# Patient Record
Sex: Male | Born: 1970 | Race: White | Hispanic: No | Marital: Married | Smoking: Never smoker
Health system: Southern US, Community
[De-identification: ages and names within clinical notes are randomized; demographics above are authoritative.]

## PROBLEM LIST (undated history)

## (undated) DIAGNOSIS — I10 Essential (primary) hypertension: Secondary | ICD-10-CM

## (undated) DIAGNOSIS — E119 Type 2 diabetes mellitus without complications: Secondary | ICD-10-CM

## (undated) DIAGNOSIS — E785 Hyperlipidemia, unspecified: Secondary | ICD-10-CM

## (undated) HISTORY — PX: WISDOM TOOTH EXTRACTION: SHX21

## (undated) HISTORY — DX: Essential (primary) hypertension: I10

## (undated) HISTORY — DX: Hyperlipidemia, unspecified: E78.5

## (undated) HISTORY — DX: Type 2 diabetes mellitus without complications: E11.9

## (undated) HISTORY — PX: TONSILLECTOMY: SUR1361

---

## 2015-04-02 DIAGNOSIS — L723 Sebaceous cyst: Secondary | ICD-10-CM | POA: Insufficient documentation

## 2018-03-10 ENCOUNTER — Ambulatory Visit: Payer: Self-pay | Admitting: Medical

## 2018-03-13 ENCOUNTER — Ambulatory Visit: Payer: Self-pay | Admitting: Medical

## 2018-03-15 ENCOUNTER — Ambulatory Visit (INDEPENDENT_AMBULATORY_CARE_PROVIDER_SITE_OTHER): Payer: Managed Care, Other (non HMO) | Admitting: Medical

## 2018-03-15 ENCOUNTER — Ambulatory Visit (HOSPITAL_BASED_OUTPATIENT_CLINIC_OR_DEPARTMENT_OTHER)
Admission: RE | Admit: 2018-03-15 | Discharge: 2018-03-15 | Disposition: A | Discharge status: Home / Self Care | Payer: Managed Care, Other (non HMO) | Source: Ambulatory Visit | Attending: Medical | Admitting: Medical

## 2018-03-15 ENCOUNTER — Encounter: Payer: Self-pay | Admitting: Medical

## 2018-03-15 VITALS — BP 154/100 | HR 72 | Temp 97.9°F | Resp 16 | Ht 71.0 in | Wt 280.1 lb

## 2018-03-15 DIAGNOSIS — M79671 Pain in right foot: Secondary | ICD-10-CM | POA: Insufficient documentation

## 2018-03-15 DIAGNOSIS — I10 Essential (primary) hypertension: Secondary | ICD-10-CM

## 2018-03-15 DIAGNOSIS — R600 Localized edema: Secondary | ICD-10-CM | POA: Diagnosis not present

## 2018-03-15 DIAGNOSIS — E785 Hyperlipidemia, unspecified: Secondary | ICD-10-CM

## 2018-03-15 MED ORDER — HYDROCHLOROTHIAZIDE 12.5 MG PO CAPS: 12.5000 mg | ORAL_CAPSULE | Freq: Every day | ORAL | 3 refills | Status: DC

## 2018-03-15 NOTE — Patient Instructions (Signed)
Your blood pressure is high today.  I do want you to start diuretic HCTZ.  Then on Tuesday next week I want you to get nurse blood pressure check.  We will see if your blood pressure has improved adequately.  If not might need to add other medication to use in combination with HCTZ.  For history of high cholesterol, recommend low-cholesterol diet.  Will check a metabolic panel and lipid panel next Tuesday morning fasting.  You have minimal right ankle pedal edema.  This swelling appears to be dependent type edema.  If your ankle swelling does not decrease daily after sleeping let me know.  Also let me know if you get any calf swelling or pain behind the knee.  In that scenario would need to order ultrasound of the lower extremity.   For right heel pain, did place x-ray order to see if you might have spur formation.  Recommend using Dr. Jabier MuttonScholl heel pads daily.  You may be experiencing some plantar fasciitis.  Follow-up date to be determined after lab review and after reviewing the next week nurse blood pressure check.

## 2018-03-15 NOTE — Progress Notes (Signed)
Subjective:    Patient ID: Aloha GellJoseph Slappey, male    DOB: 12-02-1970, 47 y.o.   MRN: [id number]  HPI   Pt in for first time.  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 Grown children.  Hx of htn. He used to be on med/diuretic medication 3 years. He states med was low dose. Pt states was low dose 5 mg.(pt dad did have have MI in past with diabetics, htn and high cholesterol). Pt uncle dad side had MI in mid 4150's.  Rt foot- mild medial aspect swelling. Mild swelling couple of month. Wore by end of the day. The decrease in morning.  On foot 12-14 hours a day. Works maintenance. No sob or trauma.  Some high cholesterol in the past. Not sure to what degree.   Review of Systems  Constitutional: Negative for chills, fatigue and fever.  HENT: Negative for congestion and drooling.   Respiratory: Negative for cough, chest tightness, shortness of breath, wheezing and stridor.   Cardiovascular: Negative for chest pain and palpitations.  Gastrointestinal: Negative for abdominal pain.  Musculoskeletal: Negative for back pain, joint swelling and neck stiffness.       Rt medial ankle swelling at times. No ankle pain.  Some mild heel pain to rt foot end of the day.  Skin: Negative for rash.  Neurological: Negative for dizziness, seizures, syncope, weakness, light-headedness and numbness.  Hematological: Negative for adenopathy. Does not bruise/bleed easily.  Psychiatric/Behavioral: Negative for behavioral problems, confusion, hallucinations and sleep disturbance. The patient is not nervous/anxious.     Past Medical History:  Diagnosis Date  . Hyperlipidemia   . Hypertension      Social History   Socioeconomic History  . Marital status: Married    Spouse name: Not on file  . Number of children: Not on file  . Years of education: Not on file  . Highest education level: Not on file  Occupational History  . Not  on file  Social Needs  . Financial resource strain: Not on file  . Food insecurity:    Worry: Not on file    Inability: Not on file  . Transportation needs:    Medical: Not on file    Non-medical: Not on file  Tobacco Use  . Smoking status: Never Smoker  . Smokeless tobacco: Never Used  Substance and Sexual Activity  . Alcohol use: Not Currently  . Drug use: Never  . Sexual activity: Yes  Lifestyle  . Physical activity:    Days per week: Not on file    Minutes per session: Not on file  . Stress: Not on file  Relationships  . Social connections:    Talks on phone: Not on file    Gets together: Not on file    Attends religious service: Not on file    Active member of club or organization: Not on file    Attends meetings of clubs or organizations: Not on file    Relationship status: Not on file  . Intimate partner violence:    Fear of current or ex partner: Not on file    Emotionally abused: Not on file    Physically abused: Not on file    Forced sexual activity: Not on file  Other Topics Concern  . Not on file  Social History Narrative  . Not on file    Past Surgical History:  Procedure Laterality Date  . TONSILLECTOMY  Family History  Problem Relation Age of Onset  . Diabetes Father   . Kidney disease Father     Not on File  No current outpatient medications on file prior to visit.   No current facility-administered medications on file prior to visit.     BP (!) 154/100   Pulse 72   Temp 97.9 F (36.6 C) (Oral)   Resp 16   Ht 5\' 11"  (1.803 m)   Wt 280 lb 1.6 oz (127.1 kg)   SpO2 98%   BMI 39.07 kg/m       Objective:   Physical Exam  General Mental Status- Alert. General Appearance- Not in acute distress.   Skin General: Color- Normal Color. Moisture- Normal Moisture.  Neck Carotid Arteries- Normal color. Moisture- Normal Moisture. No carotid bruits. No JVD.  Chest and Lung Exam Auscultation: Breath  Sounds:-Normal.  Cardiovascular Auscultation:Rythm- Regular. Murmurs & Other Heart Sounds:Auscultation of the heart reveals- No Murmurs.  Abdomen Inspection:-Inspeection Normal. Palpation/Percussion:Note:No mass. Palpation and Percussion of the abdomen reveal- Non Tender, Non Distended + BS, no rebound or guarding.    Neurologic Cranial Nerve exam:- CN III-XII intact(No nystagmus), symmetric smile. Strength:- 5/5 equal and symmetric strength both upper and lower extremities.  Rt ankle- medial aspect faint 1+ edema at best.  Rt calf- symmetric to left side. No edema.  Rt lower ext- negative homans sign. Rt foot- no obvious pain presenlty. But note mid heel pain after working all day.     Assessment & Plan:  Your blood pressure is high today.  I do want you to start diuretic HCTZ.  Then on Tuesday next week I want you to get nurse blood pressure check.  We will see if your blood pressure has improved adequately.  If not might need to add other medication to use in combination with HCTZ.  For history of high cholesterol, recommend low-cholesterol diet.  Will check a metabolic panel and lipid panel next Tuesday morning fasting.  You have minimal right ankle pedal edema.  This swelling appears to be dependent type edema.  If your ankle swelling does not decrease daily after sleeping let me know.  Also let me know if you get any calf swelling or pain behind the knee.  In that scenario would need to order ultrasound of the lower extremity.   For right heel pain, did place x-ray order to see if you might have spur formation.  Recommend using Dr. Jabier Mutton heel pads daily.  You may be experiencing some plantar fasciitis.  Follow-up date to be determined after lab review and after reviewing the next week nurse blood pressure check.  Esperanza Richters, PA-C

## 2018-03-21 ENCOUNTER — Telehealth: Payer: Self-pay | Admitting: Medical

## 2018-03-21 ENCOUNTER — Other Ambulatory Visit (INDEPENDENT_AMBULATORY_CARE_PROVIDER_SITE_OTHER): Payer: Managed Care, Other (non HMO)

## 2018-03-21 ENCOUNTER — Ambulatory Visit (INDEPENDENT_AMBULATORY_CARE_PROVIDER_SITE_OTHER): Payer: Managed Care, Other (non HMO) | Admitting: Medical

## 2018-03-21 VITALS — BP 134/92 | HR 72

## 2018-03-21 DIAGNOSIS — I10 Essential (primary) hypertension: Secondary | ICD-10-CM

## 2018-03-21 DIAGNOSIS — E785 Hyperlipidemia, unspecified: Secondary | ICD-10-CM

## 2018-03-21 LAB — CBC WITH DIFFERENTIAL/PLATELET
BASOS ABS: 0 10*3/uL (ref 0.0–0.1)
Basophils Relative: 0.2 % (ref 0.0–3.0)
EOS ABS: 0.1 10*3/uL (ref 0.0–0.7)
Eosinophils Relative: 1.3 % (ref 0.0–5.0)
HCT: 45.3 % (ref 39.0–52.0)
Hemoglobin: 15.3 g/dL (ref 13.0–17.0)
LYMPHS ABS: 2.1 10*3/uL (ref 0.7–4.0)
Lymphocytes Relative: 35.6 % (ref 12.0–46.0)
MCHC: 33.9 g/dL (ref 30.0–36.0)
MCV: 83.6 fl (ref 78.0–100.0)
Monocytes Absolute: 0.5 10*3/uL (ref 0.1–1.0)
Monocytes Relative: 8.1 % (ref 3.0–12.0)
NEUTROS PCT: 54.8 % (ref 43.0–77.0)
Neutro Abs: 3.3 10*3/uL (ref 1.4–7.7)
Platelets: 176 10*3/uL (ref 150.0–400.0)
RBC: 5.42 Mil/uL (ref 4.22–5.81)
RDW: 14.6 % (ref 11.5–15.5)
WBC: 5.9 10*3/uL (ref 4.0–10.5)

## 2018-03-21 LAB — COMPREHENSIVE METABOLIC PANEL
ALT: 79 U/L — AB (ref 0–53)
AST: 39 U/L — AB (ref 0–37)
Albumin: 4.4 g/dL (ref 3.5–5.2)
Alkaline Phosphatase: 103 U/L (ref 39–117)
BUN: 16 mg/dL (ref 6–23)
CO2: 27 mEq/L (ref 19–32)
Calcium: 9.4 mg/dL (ref 8.4–10.5)
Chloride: 100 mEq/L (ref 96–112)
Creatinine, Ser: 0.89 mg/dL (ref 0.40–1.50)
GFR: 96.66 mL/min (ref >60.00)
GLUCOSE: 248 mg/dL — AB (ref 70–99)
Potassium: 3.5 mEq/L (ref 3.5–5.1)
SODIUM: 135 meq/L (ref 135–145)
TOTAL PROTEIN: 6.9 g/dL (ref 6.0–8.3)
Total Bilirubin: 0.8 mg/dL (ref 0.2–1.2)

## 2018-03-21 MED ORDER — METFORMIN HCL ER (MOD) 500 MG PO TB24: 500.0000 mg | ORAL_TABLET | Freq: Every day | ORAL | 0 refills | Status: DC

## 2018-03-21 NOTE — Telephone Encounter (Signed)
Would you call pt pharmacy and clarify that they can give generic extended release metformin.. Not sure if I picked generic.

## 2018-03-21 NOTE — Progress Notes (Addendum)
Pre visit review using our clinic tool,if applicable. No additional management support is needed unless otherwise documented below in the visit note.   Pt here for Blood pressure check per order from Whole FoodsEdward Saguier, PA-C   Pt currently takes: Hctz 12.5 mg daily. No complaints voiced this visit.   Pt reports compliance with medication.  BP today @ =134/92  P = 86  Pt advised per Esperanza RichtersEdward Saguier, PA-C to take BP daily for 7-10 days and cal back with readings. May add a low dose BP medication to the Hctz if BP is still in the same range. Patient notified and agreed.   Advised check bp daily and document readings. Continue hctz. May add low dose lisinopril 5 mg q day or losartan 25 mg daily if diastolic note less than 90 on follow up.  Esperanza RichtersEdward Saguier, PA-C

## 2018-03-22 ENCOUNTER — Telehealth: Payer: Self-pay | Admitting: Medical

## 2018-03-22 ENCOUNTER — Other Ambulatory Visit: Payer: Self-pay | Admitting: Medical

## 2018-03-22 NOTE — Telephone Encounter (Signed)
Copied from CRM [phone]. Topic: General - Other >> Mar 22, 2018  3:31 PM Stephannie LiSimmons, Aldridge Krzyzanowski L, NT wrote: Reason for CRM: Patient called and said his insurance does not cover the extended release metformin ,

## 2018-03-23 ENCOUNTER — Other Ambulatory Visit (INDEPENDENT_AMBULATORY_CARE_PROVIDER_SITE_OTHER): Payer: Managed Care, Other (non HMO)

## 2018-03-23 DIAGNOSIS — R739 Hyperglycemia, unspecified: Secondary | ICD-10-CM | POA: Diagnosis not present

## 2018-03-23 LAB — HEMOGLOBIN A1C: Hgb A1c MFr Bld: 8.9 % — ABNORMAL HIGH (ref 4.6–6.5)

## 2018-03-23 MED ORDER — METFORMIN HCL 500 MG PO TABS: 500.0000 mg | ORAL_TABLET | Freq: Two times a day (BID) | ORAL | 3 refills | Status: DC

## 2018-03-23 NOTE — Telephone Encounter (Signed)
Pharmacy want to know if they can change metformin so insurance will cover it.

## 2018-03-23 NOTE — Telephone Encounter (Signed)
Pt.notified

## 2018-03-23 NOTE — Telephone Encounter (Signed)
Let pt know sent in metformin. Instruction say twice daily. But advise he can just start taking 1 tab daily then at 2 weeks advance to twice daily. This can reduce common side effect of gi irritation when initially starting metformin.

## 2018-04-25 ENCOUNTER — Ambulatory Visit (INDEPENDENT_AMBULATORY_CARE_PROVIDER_SITE_OTHER): Payer: Managed Care, Other (non HMO) | Admitting: Medical

## 2018-04-25 ENCOUNTER — Encounter: Payer: Self-pay | Admitting: Medical

## 2018-04-25 ENCOUNTER — Telehealth: Payer: Self-pay | Admitting: Medical

## 2018-04-25 VITALS — BP 136/86 | HR 75 | Temp 98.1°F | Resp 16 | Ht 71.0 in | Wt 277.2 lb

## 2018-04-25 DIAGNOSIS — I10 Essential (primary) hypertension: Secondary | ICD-10-CM | POA: Diagnosis not present

## 2018-04-25 DIAGNOSIS — E118 Type 2 diabetes mellitus with unspecified complications: Secondary | ICD-10-CM | POA: Diagnosis not present

## 2018-04-25 DIAGNOSIS — E119 Type 2 diabetes mellitus without complications: Secondary | ICD-10-CM

## 2018-04-25 DIAGNOSIS — Z0100 Encounter for examination of eyes and vision without abnormal findings: Secondary | ICD-10-CM

## 2018-04-25 MED ORDER — LOSARTAN POTASSIUM 25 MG PO TABS: 25.0000 mg | ORAL_TABLET | Freq: Every day | ORAL | 3 refills | Status: DC

## 2018-04-25 MED ORDER — METFORMIN HCL ER 500 MG PO TB24: ORAL_TABLET | ORAL | 3 refills | Status: DC

## 2018-04-25 NOTE — Telephone Encounter (Signed)
Will you notify patient that I did put in referral for diabetic eye exam. I put this in after visit today. Will you notify him that referral is in process.

## 2018-04-25 NOTE — Patient Instructions (Addendum)
Your blood pressure today is less than 140/90 with diabetes I would like you to have better control.  Will be to continue the diuretic but I will add low-dose losartan 5 mg to your regimen.  This should hopefully bring your blood pressure down closer to 130/80 and provide protection for your kidneys.  Your blood sugar has improved some and you have made lifestyle modifications.  Continue with better diet and daily exercise.  Since your fasting sugars are on the high side 160 range in the mornings, I do think it is best to go ahead and increase the metformin to 2 tablets twice daily.  Follow-up in 3 months or as needed.

## 2018-04-25 NOTE — Progress Notes (Signed)
Subjective:    Patient ID: Anthony Zamora, male    DOB: 1970/11/01, 47 y.o.   MRN: [id number]  HPI  Pt in for follow up on his diabetes. Pt has glucometer but has not checked in past week. But before that he was checking in the morning and his blood sugars were 160.  Pt states he has cut out fried foods, white bread and potatoes.  Pt is on metformin twice a day. No side effects reported.  Pt also did start exercising. He is riding stationary bike 20-30 minute a day.    Review of Systems  Constitutional: Negative for chills, fatigue and fever.  Respiratory: Negative for cough, chest tightness, shortness of breath and wheezing.   Cardiovascular: Negative for chest pain and palpitations.  Gastrointestinal: Negative for abdominal pain, constipation, nausea and vomiting.  Musculoskeletal: Negative for back pain.  Skin: Negative for pallor and rash.  Neurological: Negative for dizziness, speech difficulty, weakness, light-headedness and headaches.  Hematological: Negative for adenopathy. Does not bruise/bleed easily.  Psychiatric/Behavioral: Negative for behavioral problems, confusion and self-injury. The patient is not nervous/anxious.     Past Medical History:  Diagnosis Date  . Hyperlipidemia   . Hypertension      Social History   Socioeconomic History  . Marital status: Married    Spouse name: Not on file  . Number of children: Not on file  . Years of education: Not on file  . Highest education level: Not on file  Occupational History  . Not on file  Social Needs  . Financial resource strain: Not on file  . Food insecurity:    Worry: Not on file    Inability: Not on file  . Transportation needs:    Medical: Not on file    Non-medical: Not on file  Tobacco Use  . Smoking status: Never Smoker  . Smokeless tobacco: Never Used  Substance and Sexual Activity  . Alcohol use: Not Currently  . Drug use: Never  . Sexual activity: Yes  Lifestyle  . Physical  activity:    Days per week: Not on file    Minutes per session: Not on file  . Stress: Not on file  Relationships  . Social connections:    Talks on phone: Not on file    Gets together: Not on file    Attends religious service: Not on file    Active member of club or organization: Not on file    Attends meetings of clubs or organizations: Not on file    Relationship status: Not on file  . Intimate partner violence:    Fear of current or ex partner: Not on file    Emotionally abused: Not on file    Physically abused: Not on file    Forced sexual activity: Not on file  Other Topics Concern  . Not on file  Social History Narrative  . Not on file    Past Surgical History:  Procedure Laterality Date  . TONSILLECTOMY      Family History  Problem Relation Age of Onset  . Diabetes Father   . Kidney disease Father     Not on File  Current Outpatient Medications on File Prior to Visit  Medication Sig Dispense Refill  . hydrochlorothiazide (MICROZIDE) 12.5 MG capsule Take 1 capsule (12.5 mg total) by mouth daily. 30 capsule 3  . metFORMIN (GLUCOPHAGE) 500 MG tablet Take 1 tablet (500 mg total) by mouth 2 (two) times daily with a meal. 60 tablet  3   No current facility-administered medications on file prior to visit.     BP (!) 138/93   Pulse 75   Temp 98.1 F (36.7 C) (Oral)   Resp 16   Ht 5\' 11"  (1.803 m)   Wt 277 lb 3.2 oz (125.7 kg)   SpO2 99%   BMI 38.66 kg/m       Objective:   Physical Exam  General Mental Status- Alert. General Appearance- Not in acute distress.   Skin General: Color- Normal Color. Moisture- Normal Moisture.  Neck Carotid Arteries- Normal color. Moisture- Normal Moisture. No carotid bruits. No JVD.  Chest and Lung Exam Auscultation: Breath Sounds:-Normal.  Cardiovascular Auscultation:Rythm- Regular. Murmurs & Other Heart Sounds:Auscultation of the heart reveals- No Murmurs.  Abdomen Inspection:-Inspeection  Normal. Palpation/Percussion:Note:No mass. Palpation and Percussion of the abdomen reveal- Non Tender, Non Distended + BS, no rebound or guarding.    Neurologic Cranial Nerve exam:- CN III-XII intact(No nystagmus), symmetric smile. Strength:- 5/5 equal and symmetric strength both upper and lower extremities.  Lower ext- see quality metrics.   Assessment & Plan:  Your blood pressure today is less than 140/90 with diabetes I would like you to have better control.  Will be to continue the diuretic but I will add low-dose losartan 5 mg to your regimen.  This should hopefully bring your blood pressure down closer to 130/80 and provide protection for your kidneys.  Your blood sugar has improved some and you have made lifestyle modifications.  Continue with better diet and daily exercise.  Since your fasting sugars are on the high side 160 range in the mornings, I do think it is best to go ahead and increase the metformin to 2 tablets twice daily.  Follow-up in 3 months or as needed.

## 2018-05-02 ENCOUNTER — Other Ambulatory Visit: Payer: Self-pay | Admitting: Medical

## 2018-06-12 ENCOUNTER — Other Ambulatory Visit: Payer: Self-pay | Admitting: Medical

## 2018-06-27 ENCOUNTER — Other Ambulatory Visit: Payer: Self-pay | Admitting: Medical

## 2018-07-26 ENCOUNTER — Encounter: Payer: Self-pay | Admitting: Medical

## 2018-07-26 ENCOUNTER — Ambulatory Visit (INDEPENDENT_AMBULATORY_CARE_PROVIDER_SITE_OTHER): Payer: Managed Care, Other (non HMO) | Admitting: Medical

## 2018-07-26 VITALS — BP 140/90 | HR 72 | Temp 98.2°F | Resp 16 | Ht 71.0 in | Wt 275.6 lb

## 2018-07-26 DIAGNOSIS — I10 Essential (primary) hypertension: Secondary | ICD-10-CM | POA: Diagnosis not present

## 2018-07-26 DIAGNOSIS — E785 Hyperlipidemia, unspecified: Secondary | ICD-10-CM

## 2018-07-26 DIAGNOSIS — E118 Type 2 diabetes mellitus with unspecified complications: Secondary | ICD-10-CM

## 2018-07-26 LAB — COMPREHENSIVE METABOLIC PANEL
ALT: 69 U/L — ABNORMAL HIGH (ref 0–53)
AST: 32 U/L (ref 0–37)
Albumin: 4.5 g/dL (ref 3.5–5.2)
Alkaline Phosphatase: 92 U/L (ref 39–117)
BILIRUBIN TOTAL: 0.6 mg/dL (ref 0.2–1.2)
BUN: 17 mg/dL (ref 6–23)
CALCIUM: 9.5 mg/dL (ref 8.4–10.5)
CO2: 25 mEq/L (ref 19–32)
Chloride: 103 mEq/L (ref 96–112)
Creatinine, Ser: 0.78 mg/dL (ref 0.40–1.50)
GFR: 112.39 mL/min (ref >60.00)
Glucose, Bld: 222 mg/dL — ABNORMAL HIGH (ref 70–99)
Potassium: 3.9 mEq/L (ref 3.5–5.1)
Sodium: 138 mEq/L (ref 135–145)
Total Protein: 6.8 g/dL (ref 6.0–8.3)

## 2018-07-26 LAB — LIPID PANEL
CHOLESTEROL: 184 mg/dL (ref 0–200)
HDL: 39.5 mg/dL (ref >39.00)
NonHDL: 144.21
Total CHOL/HDL Ratio: 5
Triglycerides: 372 mg/dL — ABNORMAL HIGH (ref 0.0–149.0)
VLDL: 74.4 mg/dL — ABNORMAL HIGH (ref 0.0–40.0)

## 2018-07-26 LAB — LDL CHOLESTEROL, DIRECT: Direct LDL: 76 mg/dL

## 2018-07-26 LAB — HEMOGLOBIN A1C: HEMOGLOBIN A1C: 8.7 % — AB (ref 4.6–6.5)

## 2018-07-26 NOTE — Progress Notes (Signed)
Subjective:    Patient ID: Anthony Zamora, male    DOB: February 10, 1971, 47 y.o.   MRN: [id number]  HPI  Pt in for follow up.  His bp had work has been upper 130/80 range. He states job not too stressful.  No cardiac or neurologic signs/symptoms.  Pt went up on metformin two 2 tab twice a day. Pt states has not checked his sugar in past 2 weeks. He states he has been cutting back on sugar in diet. Pt has been using treadmill at work 30 minutes 3 days a week.      Review of Systems  Constitutional: Negative for chills, fatigue and fever.  Respiratory: Negative for chest tightness, shortness of breath and wheezing.   Cardiovascular: Negative for chest pain and palpitations.  Gastrointestinal: Negative for abdominal pain.  Musculoskeletal: Negative for back pain.  Skin: Negative for rash.  Neurological: Negative for dizziness and light-headedness.  Hematological: Negative for adenopathy. Does not bruise/bleed easily.  Psychiatric/Behavioral: Negative for behavioral problems and confusion.    Past Medical History:  Diagnosis Date  . Hyperlipidemia   . Hypertension      Social History   Socioeconomic History  . Marital status: Married    Spouse name: Not on file  . Number of children: Not on file  . Years of education: Not on file  . Highest education level: Not on file  Occupational History  . Not on file  Social Needs  . Financial resource strain: Not on file  . Food insecurity:    Worry: Not on file    Inability: Not on file  . Transportation needs:    Medical: Not on file    Non-medical: Not on file  Tobacco Use  . Smoking status: Never Smoker  . Smokeless tobacco: Never Used  Substance and Sexual Activity  . Alcohol use: Not Currently  . Drug use: Never  . Sexual activity: Yes  Lifestyle  . Physical activity:    Days per week: Not on file    Minutes per session: Not on file  . Stress: Not on file  Relationships  . Social connections:    Talks on  phone: Not on file    Gets together: Not on file    Attends religious service: Not on file    Active member of club or organization: Not on file    Attends meetings of clubs or organizations: Not on file    Relationship status: Not on file  . Intimate partner violence:    Fear of current or ex partner: Not on file    Emotionally abused: Not on file    Physically abused: Not on file    Forced sexual activity: Not on file  Other Topics Concern  . Not on file  Social History Narrative  . Not on file    Past Surgical History:  Procedure Laterality Date  . TONSILLECTOMY      Family History  Problem Relation Age of Onset  . Diabetes Father   . Kidney disease Father     Not on File  Current Outpatient Medications on File Prior to Visit  Medication Sig Dispense Refill  . hydrochlorothiazide (MICROZIDE) 12.5 MG capsule TAKE 1 CAPSULE BY MOUTH EVERY DAY 90 capsule 2  . losartan (COZAAR) 25 MG tablet TAKE 1 TABLET BY MOUTH EVERY DAY 90 tablet 0  . metFORMIN (GLUCOPHAGE-XR) 500 MG 24 hr tablet TAKE 2 TABLETS BY MOUTH TWICE A DAY 120 tablet 5   No current  facility-administered medications on file prior to visit.     BP (!) 148/97   Pulse 72   Temp 98.2 F (36.8 C) (Oral)   Resp 16   Ht 5\' 11"  (1.803 m)   Wt 275 lb 9.6 oz (125 kg)   SpO2 99%   BMI 38.44 kg/m      Objective:   Physical Exam  General Mental Status- Alert. General Appearance- Not in acute distress.   Skin General: Color- Normal Color. Moisture- Normal Moisture.  Neck Carotid Arteries- Normal color. Moisture- Normal Moisture. No carotid bruits. No JVD.  Chest and Lung Exam Auscultation: Breath Sounds:-Normal.  Cardiovascular Auscultation:Rythm- Regular. Murmurs & Other Heart Sounds:Auscultation of the heart reveals- No Murmurs.  Abdomen Inspection:-Inspeection Normal. Palpation/Percussion:Note:No mass. Palpation and Percussion of the abdomen reveal- Non Tender, Non Distended + BS, no rebound or  guarding.    Neurologic Cranial Nerve exam:- CN III-XII intact(No nystagmus), symmetric smile. Strength:- 5/5 equal and symmetric strength both upper and lower extremities.       Assessment & Plan:  Your bp is borderline high today. You get better reading at work but want your bp to be closer to 130/80. Check bp daily and if bp confirmed high/closer to 140/90 then start losartan 50 mg daily/2 of your current 25 mg tabs and continue other bp meds the same.  For diabetes, continue exercise and metformin. Check cmp and a1c today.  Will check lipid panel as well.  Follow up date to be determined after lab review. Likely 3 months. Please call/update me in a week if advanced the losartan dose.  25 minutes spent with pt. 50% of time spent discussing plan on potential increase of bp med depending on daily bp readings.   Esperanza RichtersEdward Sheyann Sulton, PA-C

## 2018-07-26 NOTE — Patient Instructions (Signed)
Your bp is borderline high today. You get better reading at work but want your bp to be closer to 130/80. Check bp daily and if bp confirmed high/closer to 140/90 then start losartan 50 mg daily/2 of your current 25 mg tabs and continue other bp meds the same.  For diabetes, continue exercise and metformin. Check cmp and a1c today.  Will check lipid panel as well.  Follow up date to be determined after lab review. Likely 3 months. Please call/update me in a week if advanced the losartan dose.

## 2018-07-28 ENCOUNTER — Telehealth: Payer: Self-pay | Admitting: Medical

## 2018-07-28 ENCOUNTER — Other Ambulatory Visit: Payer: Self-pay | Admitting: Medical

## 2018-07-28 MED ORDER — SAXAGLIPTIN HCL 5 MG PO TABS: 5.0000 mg | ORAL_TABLET | Freq: Every day | ORAL | 3 refills | Status: DC

## 2018-07-28 MED ORDER — FENOFIBRATE 54 MG PO TABS: 54.0000 mg | ORAL_TABLET | Freq: Every day | ORAL | 3 refills | Status: DC

## 2018-07-28 NOTE — Telephone Encounter (Signed)
Rx januvia sent to pharmacy. 

## 2018-07-28 NOTE — Telephone Encounter (Signed)
Rx fenofibrate and onglyza sent to pt pharmacy.

## 2018-08-03 ENCOUNTER — Ambulatory Visit: Payer: Self-pay

## 2018-08-03 NOTE — Telephone Encounter (Signed)
Wife Marylene Land(Angela) who is on DPR, called wanting to know what medication pt is supposed to be taking. Discussed the medication list with her. Discussed his latest Hgb A1C. Wife discussed lifestyle changes her and the pt are going to make to bring down his Hgb A1C.  Reason for Disposition . General information question, no triage required and triager able to answer question  Answer Assessment - Initial Assessment Questions 1. REASON FOR CALL or QUESTION: "What is your reason for calling today?" or "How can I best help you?" or "What question do you have that I can help answer?"     Wife Marylene Land(Angela) who is on DPR, called wanting to know what medication pt is supposed to be taking. Discussed the medication list with her. Discussed his latest Hgb A1C. Wife discussed lifestyle changes her and the pt are going to make to bring down his Hgb A1C.  Protocols used: INFORMATION ONLY CALL-A-AH

## 2018-09-20 ENCOUNTER — Other Ambulatory Visit: Payer: Self-pay | Admitting: Medical

## 2018-10-03 ENCOUNTER — Other Ambulatory Visit: Payer: Self-pay | Admitting: Medical

## 2018-11-17 ENCOUNTER — Other Ambulatory Visit: Payer: Self-pay | Admitting: Medical

## 2018-11-17 MED ORDER — SITAGLIPTIN PHOSPHATE 100 MG PO TABS: 100.0000 mg | ORAL_TABLET | Freq: Every day | ORAL | 0 refills | Status: DC

## 2018-11-29 ENCOUNTER — Telehealth: Payer: Self-pay | Admitting: Medical

## 2018-11-29 NOTE — Telephone Encounter (Signed)
Pt overdue for diabetic and htn follow up. Can you go ahead and schedule him for visit. Early June inform may be virtual or in office(so labs can be done at that time). Depends on what is going on. If any acute issues now then can do virtual visit now.

## 2018-11-30 NOTE — Telephone Encounter (Signed)
LVM for pt to call and schedule fu appt in June per Provider.

## 2018-12-07 ENCOUNTER — Other Ambulatory Visit: Payer: Self-pay | Admitting: Medical

## 2019-02-04 NOTE — Patient Instructions (Addendum)
Your bp is  borderline  controlled but controlled at home high 120-140/80.  Continue losartan and hctz.  For diabetes continue metformin and januvia. DC extended release metformin.  For high triglycerides continue fenofibrate and will get lipid panel. Discussed benefit of low dose statin for him since diabetic.  Will get labs and notify you of results.  Follow up in 3 months or as needed

## 2019-02-04 NOTE — Progress Notes (Signed)
Subjective:    Patient ID: Anthony Zamora, male    DOB: 08-29-1970, 48 y.o.   MRN: [id number]  HPI  Pt has follow up for diabetes, htn and high cholesterol.  Last visit was 8.7 A1-c. Pt is on metformin and added januvia to hes med regimen. Recall on metformin ER. He had no gi side effects with regular form of metformin.  Pt also has high cholesterol/triglycerides and rx'd fenofibrate.   For htn pt has been prescribed  Losartan and hctz.  Pt is overdue for fasting labs.  Pt states he did by treadmill and he is exercising 3-4 times a week. 20-30 minutes. Pt is eating healing.     Review of Systems  Constitutional: Negative for chills, fatigue and fever.  HENT: Negative for congestion and dental problem.   Respiratory: Negative for cough, chest tightness, shortness of breath and wheezing.   Cardiovascular: Negative for chest pain and palpitations.  Gastrointestinal: Negative for abdominal distention, abdominal pain, constipation, nausea and rectal pain.  Endocrine: Negative for polydipsia, polyphagia and polyuria.  Genitourinary: Negative for dysuria, enuresis, flank pain, frequency, hematuria, testicular pain and urgency.  Musculoskeletal: Negative for back pain and myalgias.  Skin: Negative for rash.  Neurological: Negative for dizziness, weakness, light-headedness and headaches.  Hematological: Negative for adenopathy. Does not bruise/bleed easily.  Psychiatric/Behavioral: Negative for behavioral problems and confusion.    Past Medical History:  Diagnosis Date  . Hyperlipidemia   . Hypertension      Social History   Socioeconomic History  . Marital status: Married    Spouse name: Not on file  . Number of children: Not on file  . Years of education: Not on file  . Highest education level: Not on file  Occupational History  . Not on file  Social Needs  . Financial resource strain: Not on file  . Food insecurity    Worry: Not on file    Inability: Not on file   . Transportation needs    Medical: Not on file    Non-medical: Not on file  Tobacco Use  . Smoking status: Never Smoker  . Smokeless tobacco: Never Used  Substance and Sexual Activity  . Alcohol use: Not Currently  . Drug use: Never  . Sexual activity: Yes  Lifestyle  . Physical activity    Days per week: Not on file    Minutes per session: Not on file  . Stress: Not on file  Relationships  . Social Herbalist on phone: Not on file    Gets together: Not on file    Attends religious service: Not on file    Active member of club or organization: Not on file    Attends meetings of clubs or organizations: Not on file    Relationship status: Not on file  . Intimate partner violence    Fear of current or ex partner: Not on file    Emotionally abused: Not on file    Physically abused: Not on file    Forced sexual activity: Not on file  Other Topics Concern  . Not on file  Social History Narrative  . Not on file    Past Surgical History:  Procedure Laterality Date  . TONSILLECTOMY      Family History  Problem Relation Age of Onset  . Diabetes Father   . Kidney disease Father     Not on File  Current Outpatient Medications on File Prior to Visit  Medication Sig Dispense  Refill  . fenofibrate 54 MG tablet TAKE 1 TABLET BY MOUTH DAILY 90 tablet 2  . hydrochlorothiazide (MICROZIDE) 12.5 MG capsule TAKE 1 CAPSULE BY MOUTH EVERY DAY 90 capsule 2  . losartan (COZAAR) 25 MG tablet TAKE 1 TABLET BY MOUTH EVERY DAY 90 tablet 0  . metFORMIN (GLUCOPHAGE-XR) 500 MG 24 hr tablet TAKE 2 TABLETS BY MOUTH TWICE A DAY 120 tablet 5  . sitaGLIPtin (JANUVIA) 100 MG tablet Take 1 tablet (100 mg total) by mouth daily. 90 tablet 0   No current facility-administered medications on file prior to visit.         Objective:   Physical Exam  General Mental Status- Alert. General Appearance- Not in acute distress.   Skin General: Color- Normal Color. Moisture- Normal Moisture.   Neck Carotid Arteries- Normal color. Moisture- Normal Moisture. No carotid bruits. No JVD.  Chest and Lung Exam Auscultation: Breath Sounds:-Normal.  Cardiovascular Auscultation:Rythm- Regular. Murmurs & Other Heart Sounds:Auscultation of the heart reveals- No Murmurs.  Abdomen Inspection:-Inspeection Normal. Palpation/Percussion:Note:No mass. Palpation and Percussion of the abdomen reveal- Non Tender, Non Distended + BS, no rebound or guarding.   Neurologic Cranial Nerve exam:- CN III-XII intact(No nystagmus), symmetric smile. Drift Test:- No drift. Romberg Exam:- Negative.  Heal to Toe Gait exam:-Normal. Finger to Nose:- Normal/Intact Strength:- 5/5 equal and symmetric strength both upper and lower extremities.  Lower ext- see quality metrics.      Assessment & Plan:  Your bp is  borderline  controlled but controlled at home high 120's-140/80.  Continue losartan and hctz.  For diabetes continue metformin and januvia.DC extended release metformin.  For high triglycerides continue fenofibrate and will get lipid panel. Discussed benefit of low dose statin for him since diabetic.  Will get labs and notify you of results.  Follow up in 3 months or as needed

## 2019-02-05 ENCOUNTER — Ambulatory Visit (INDEPENDENT_AMBULATORY_CARE_PROVIDER_SITE_OTHER): Payer: Managed Care, Other (non HMO) | Admitting: Medical

## 2019-02-05 ENCOUNTER — Encounter: Payer: Self-pay | Admitting: Medical

## 2019-02-05 ENCOUNTER — Other Ambulatory Visit: Payer: Self-pay

## 2019-02-05 VITALS — BP 140/88 | HR 63 | Ht 71.0 in | Wt 276.0 lb

## 2019-02-05 DIAGNOSIS — I10 Essential (primary) hypertension: Secondary | ICD-10-CM | POA: Diagnosis not present

## 2019-02-05 DIAGNOSIS — Z0100 Encounter for examination of eyes and vision without abnormal findings: Secondary | ICD-10-CM | POA: Diagnosis not present

## 2019-02-05 DIAGNOSIS — E119 Type 2 diabetes mellitus without complications: Secondary | ICD-10-CM

## 2019-02-05 DIAGNOSIS — E118 Type 2 diabetes mellitus with unspecified complications: Secondary | ICD-10-CM | POA: Diagnosis not present

## 2019-02-05 DIAGNOSIS — E781 Pure hyperglyceridemia: Secondary | ICD-10-CM | POA: Diagnosis not present

## 2019-02-05 LAB — COMPREHENSIVE METABOLIC PANEL
ALT: 54 U/L — ABNORMAL HIGH (ref 0–53)
AST: 28 U/L (ref 0–37)
Albumin: 4.4 g/dL (ref 3.5–5.2)
Alkaline Phosphatase: 68 U/L (ref 39–117)
BUN: 17 mg/dL (ref 6–23)
CO2: 26 mEq/L (ref 19–32)
Calcium: 9.1 mg/dL (ref 8.4–10.5)
Chloride: 101 mEq/L (ref 96–112)
Creatinine, Ser: 0.87 mg/dL (ref 0.40–1.50)
GFR: 93.02 mL/min (ref >60.00)
Glucose, Bld: 158 mg/dL — ABNORMAL HIGH (ref 70–99)
Potassium: 4.1 mEq/L (ref 3.5–5.1)
Sodium: 137 mEq/L (ref 135–145)
Total Bilirubin: 0.7 mg/dL (ref 0.2–1.2)
Total Protein: 6.5 g/dL (ref 6.0–8.3)

## 2019-02-05 LAB — MICROALBUMIN / CREATININE URINE RATIO
Creatinine,U: 76.6 mg/dL
Microalb Creat Ratio: 0.9 mg/g (ref 0.0–30.0)
Microalb, Ur: <0.7 mg/dL (ref 0.0–1.9)

## 2019-02-05 LAB — LIPID PANEL
Cholesterol: 183 mg/dL (ref 0–200)
HDL: 39.6 mg/dL (ref >39.00)
NonHDL: 143.07
Total CHOL/HDL Ratio: 5
Triglycerides: 240 mg/dL — ABNORMAL HIGH (ref 0.0–149.0)
VLDL: 48 mg/dL — ABNORMAL HIGH (ref 0.0–40.0)

## 2019-02-05 LAB — HEMOGLOBIN A1C: Hgb A1c MFr Bld: 7.8 % — ABNORMAL HIGH (ref 4.6–6.5)

## 2019-02-05 LAB — LDL CHOLESTEROL, DIRECT: Direct LDL: 108 mg/dL

## 2019-02-05 MED ORDER — METFORMIN HCL 1000 MG PO TABS: 1000.0000 mg | ORAL_TABLET | Freq: Two times a day (BID) | ORAL | 3 refills | Status: DC

## 2019-02-05 NOTE — Addendum Note (Signed)
Addended by: Kelle Darting A on: 02/05/2019 08:46 AM   Modules accepted: Orders

## 2019-04-07 ENCOUNTER — Other Ambulatory Visit: Payer: Self-pay | Admitting: Internal Medicine

## 2019-04-24 ENCOUNTER — Other Ambulatory Visit: Payer: Self-pay | Admitting: Medical

## 2019-05-08 ENCOUNTER — Other Ambulatory Visit: Payer: Self-pay

## 2019-05-09 ENCOUNTER — Ambulatory Visit: Payer: Managed Care, Other (non HMO) | Admitting: Medical

## 2019-05-10 ENCOUNTER — Encounter: Payer: Self-pay | Admitting: Medical

## 2019-05-10 ENCOUNTER — Ambulatory Visit (HOSPITAL_BASED_OUTPATIENT_CLINIC_OR_DEPARTMENT_OTHER)
Admission: RE | Admit: 2019-05-10 | Discharge: 2019-05-10 | Disposition: A | Discharge status: Home / Self Care | Payer: Managed Care, Other (non HMO) | Source: Ambulatory Visit | Attending: Medical | Admitting: Medical

## 2019-05-10 ENCOUNTER — Ambulatory Visit (INDEPENDENT_AMBULATORY_CARE_PROVIDER_SITE_OTHER): Payer: Managed Care, Other (non HMO) | Admitting: Medical

## 2019-05-10 ENCOUNTER — Other Ambulatory Visit: Payer: Self-pay | Admitting: Medical

## 2019-05-10 ENCOUNTER — Other Ambulatory Visit: Payer: Self-pay

## 2019-05-10 VITALS — BP 130/80 | HR 71 | Temp 97.2°F | Resp 16 | Ht 71.0 in | Wt 271.6 lb

## 2019-05-10 DIAGNOSIS — I10 Essential (primary) hypertension: Secondary | ICD-10-CM

## 2019-05-10 DIAGNOSIS — M79671 Pain in right foot: Secondary | ICD-10-CM

## 2019-05-10 DIAGNOSIS — E785 Hyperlipidemia, unspecified: Secondary | ICD-10-CM | POA: Diagnosis not present

## 2019-05-10 DIAGNOSIS — Z23 Encounter for immunization: Secondary | ICD-10-CM | POA: Diagnosis not present

## 2019-05-10 DIAGNOSIS — E118 Type 2 diabetes mellitus with unspecified complications: Secondary | ICD-10-CM | POA: Diagnosis not present

## 2019-05-10 LAB — LIPID PANEL
Cholesterol: 176 mg/dL (ref 0–200)
HDL: 41.4 mg/dL (ref >39.00)
NonHDL: 134.4
Total CHOL/HDL Ratio: 4
Triglycerides: 252 mg/dL — ABNORMAL HIGH (ref 0.0–149.0)
VLDL: 50.4 mg/dL — ABNORMAL HIGH (ref 0.0–40.0)

## 2019-05-10 LAB — COMPREHENSIVE METABOLIC PANEL
ALT: 63 U/L — ABNORMAL HIGH (ref 0–53)
AST: 34 U/L (ref 0–37)
Albumin: 4.7 g/dL (ref 3.5–5.2)
Alkaline Phosphatase: 73 U/L (ref 39–117)
BUN: 16 mg/dL (ref 6–23)
CO2: 28 mEq/L (ref 19–32)
Calcium: 9.8 mg/dL (ref 8.4–10.5)
Chloride: 102 mEq/L (ref 96–112)
Creatinine, Ser: 0.96 mg/dL (ref 0.40–1.50)
GFR: 82.94 mL/min (ref >60.00)
Glucose, Bld: 130 mg/dL — ABNORMAL HIGH (ref 70–99)
Potassium: 3.7 mEq/L (ref 3.5–5.1)
Sodium: 137 mEq/L (ref 135–145)
Total Bilirubin: 0.8 mg/dL (ref 0.2–1.2)
Total Protein: 7.1 g/dL (ref 6.0–8.3)

## 2019-05-10 LAB — HEMOGLOBIN A1C: Hgb A1c MFr Bld: 7.6 % — ABNORMAL HIGH (ref 4.6–6.5)

## 2019-05-10 LAB — LDL CHOLESTEROL, DIRECT: Direct LDL: 93 mg/dL

## 2019-05-10 MED ORDER — ATORVASTATIN CALCIUM 10 MG PO TABS: 10.0000 mg | ORAL_TABLET | Freq: Every day | ORAL | 3 refills | Status: DC

## 2019-05-10 NOTE — Addendum Note (Signed)
Addended by: Hinton Dyer on: 05/10/2019 10:57 AM   Modules accepted: Orders

## 2019-05-10 NOTE — Addendum Note (Signed)
Addended by: Anabel Halon on: 05/10/2019 10:58 AM   Modules accepted: Orders

## 2019-05-10 NOTE — Progress Notes (Signed)
Subjective:    Patient ID: Anthony Zamora, male    DOB: 21-Nov-1970, 48 y.o.   MRN: [id number]  HPI  Pt in for follow up visit.  Pt states has been eating low sugar diet. He is trying to lose weight. Lost 5-6 lbs with eating less carbs. Sugar in morning have been 140-160.   Recent rt heal pain for about 2-3 weeks. First step in morning very painful. Then later in day pain comes back and throbs.  Pt has hx of high cholesterol.   Pt bp little high on diastolic initially.     Review of Systems  Constitutional: Negative for chills, fatigue and fever.  HENT: Negative for congestion and dental problem.   Respiratory: Negative for cough, chest tightness, shortness of breath and wheezing.   Cardiovascular: Negative for chest pain and palpitations.  Gastrointestinal: Negative for abdominal pain.  Genitourinary: Negative for dysuria, flank pain and frequency.  Musculoskeletal: Negative for back pain and myalgias.  Skin: Negative for rash.  Neurological: Negative for dizziness, speech difficulty, weakness and headaches.  Hematological: Negative for adenopathy. Does not bruise/bleed easily.  Psychiatric/Behavioral: Negative for behavioral problems and confusion.      Past Medical History:  Diagnosis Date  . Hyperlipidemia   . Hypertension      Social History   Socioeconomic History  . Marital status: Married    Spouse name: Not on file  . Number of children: Not on file  . Years of education: Not on file  . Highest education level: Not on file  Occupational History  . Not on file  Social Needs  . Financial resource strain: Not on file  . Food insecurity    Worry: Not on file    Inability: Not on file  . Transportation needs    Medical: Not on file    Non-medical: Not on file  Tobacco Use  . Smoking status: Never Smoker  . Smokeless tobacco: Never Used  Substance and Sexual Activity  . Alcohol use: Not Currently  . Drug use: Never  . Sexual activity: Yes   Lifestyle  . Physical activity    Days per week: Not on file    Minutes per session: Not on file  . Stress: Not on file  Relationships  . Social Herbalist on phone: Not on file    Gets together: Not on file    Attends religious service: Not on file    Active member of club or organization: Not on file    Attends meetings of clubs or organizations: Not on file    Relationship status: Not on file  . Intimate partner violence    Fear of current or ex partner: Not on file    Emotionally abused: Not on file    Physically abused: Not on file    Forced sexual activity: Not on file  Other Topics Concern  . Not on file  Social History Narrative  . Not on file    Past Surgical History:  Procedure Laterality Date  . TONSILLECTOMY      Family History  Problem Relation Age of Onset  . Diabetes Father   . Kidney disease Father     Not on File  Current Outpatient Medications on File Prior to Visit  Medication Sig Dispense Refill  . fenofibrate 54 MG tablet TAKE 1 TABLET BY MOUTH DAILY 90 tablet 2  . hydrochlorothiazide (MICROZIDE) 12.5 MG capsule TAKE 1 CAPSULE BY MOUTH EVERY DAY 90 capsule 2  .  losartan (COZAAR) 25 MG tablet TAKE 1 TABLET BY MOUTH EVERY DAY 90 tablet 0  . metFORMIN (GLUCOPHAGE) 1000 MG tablet Take 1 tablet (1,000 mg total) by mouth 2 (two) times daily with a meal. 180 tablet 3  . sitaGLIPtin (JANUVIA) 100 MG tablet Take 1 tablet (100 mg total) by mouth daily. (Patient not taking: Reported on 05/10/2019) 90 tablet 0   No current facility-administered medications on file prior to visit.     BP (!) 138/92   Pulse 71   Temp (!) 97.2 F (36.2 C) (Temporal)   Resp 16   Ht 5\' 11"  (1.803 m)   Wt 271 lb 9.6 oz (123.2 kg)   SpO2 99%   BMI 37.88 kg/m       Objective:   Physical Exam  General Mental Status- Alert. General Appearance- Not in acute distress.   Skin General: Color- Normal Color. Moisture- Normal Moisture.  Neck Carotid Arteries-  Normal color. Moisture- Normal Moisture. No carotid bruits. No JVD.  Chest and Lung Exam Auscultation: Breath Sounds:-Normal.  Cardiovascular Auscultation:Rythm- Regular. Murmurs & Other Heart Sounds:Auscultation of the heart reveals- No Murmurs.  Abdomen Inspection:-Inspeection Normal. Palpation/Percussion:Note:No mass. Palpation and Percussion of the abdomen reveal- Non Tender, Non Distended + BS, no rebound or guarding.  Neurologic Cranial Nerve exam:- CN III-XII intact(No nystagmus), symmetric smile. Strength:- 5/5 equal and symmetric strength both upper and lower extremities.  Lower ext- see quality metrics. Rt foot- heel pain to palpation.      Assessment & Plan:  For  diabetes will get A1c today to evaluate 6161-month sugar average.  Continue current medications but might make changes to regimen if A1c increased.  Also recommend that he  continue to try to lose weight as weight loss might decrease insulin resistance.  Your blood pressure is in good range today after a second recheck.  Continue losartan.  For high cholesterol and diabetes, I do think is a good idea for you to start atorvastatin.  Prescribe low dose and will recheck lipid panel today.  For right foot pain, will get x-ray of foot and see if you have heel spur.  Recommend Dr. Margart SicklesScholl's heel pads presently to see if this helps.  You can use Tylenol for mild pain but if you have worse pain then try ibuprofen.  Pain persists or if worsening then consider referral to podiatrist.  Follow-up in 3 months or as needed.  25 minutes spent with pt. 50% of time spent with pt counseling on dx and tx plan.  Esperanza RichtersEdward Jamond Neels, PA-C

## 2019-05-10 NOTE — Patient Instructions (Addendum)
For  diabetes will get A1c today to evaluate 29-month sugar average.  Continue current medications but might make changes to regimen if A1c increased.  Also recommend that he  continue to try to lose weight as weight loss might decrease insulin resistance.  Your blood pressure is in good range today after a second recheck.  Continue losartan.  For high cholesterol and diabetes, I do think is a good idea for you to start atorvastatin.  Prescribe low dose and will recheck lipid panel today.  For right foot pain, will get x-ray of foot and see if you have heel spur.  Recommend Dr. Felicie Morn heel pads presently to see if this helps.  You can use Tylenol for mild pain but if you have worse pain then try ibuprofen.  Pain persists or if worsening then consider referral to podiatrist.  Follow-up in 3 months or as needed.

## 2019-05-11 MED ORDER — SITAGLIPTIN PHOSPHATE 100 MG PO TABS: 100.0000 mg | ORAL_TABLET | Freq: Every day | ORAL | 1 refills | Status: DC

## 2019-07-20 ENCOUNTER — Telehealth: Payer: Self-pay | Admitting: Medical

## 2019-07-20 DIAGNOSIS — Z1211 Encounter for screening for malignant neoplasm of colon: Secondary | ICD-10-CM

## 2019-07-20 NOTE — Telephone Encounter (Signed)
Screening colonoscopy order placed. Pt had wanted it done by December 3rd or so. Very quick timeframe. So would you give pt number to GI office so he can call and ask about being seen that quickly.

## 2019-07-23 ENCOUNTER — Encounter: Payer: Self-pay | Admitting: Gastroenterology

## 2019-07-24 NOTE — Telephone Encounter (Signed)
Pt states GI call him yesterday to make an appointment.

## 2019-07-30 IMAGING — DX DG FOOT COMPLETE 3+V*R*
3 series · 3 of 3 positions shown · non-contrast
Comparison: None.

CLINICAL DATA: Right heel pain and swelling, no known injury,
initial encounter

EXAM:
RIGHT FOOT COMPLETE - 3+ VIEW

[foot ap]
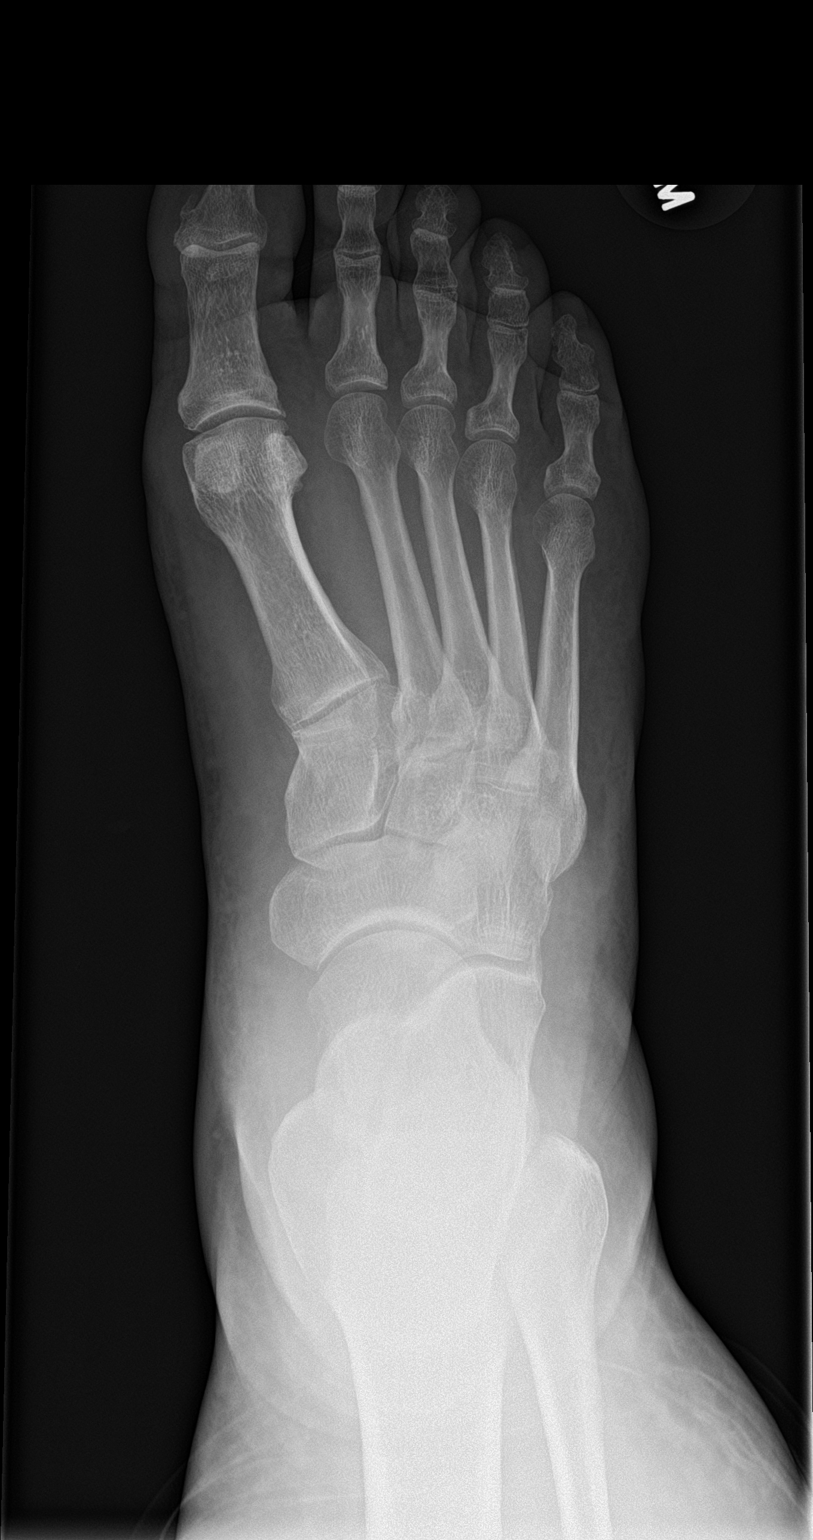

[foot obl]
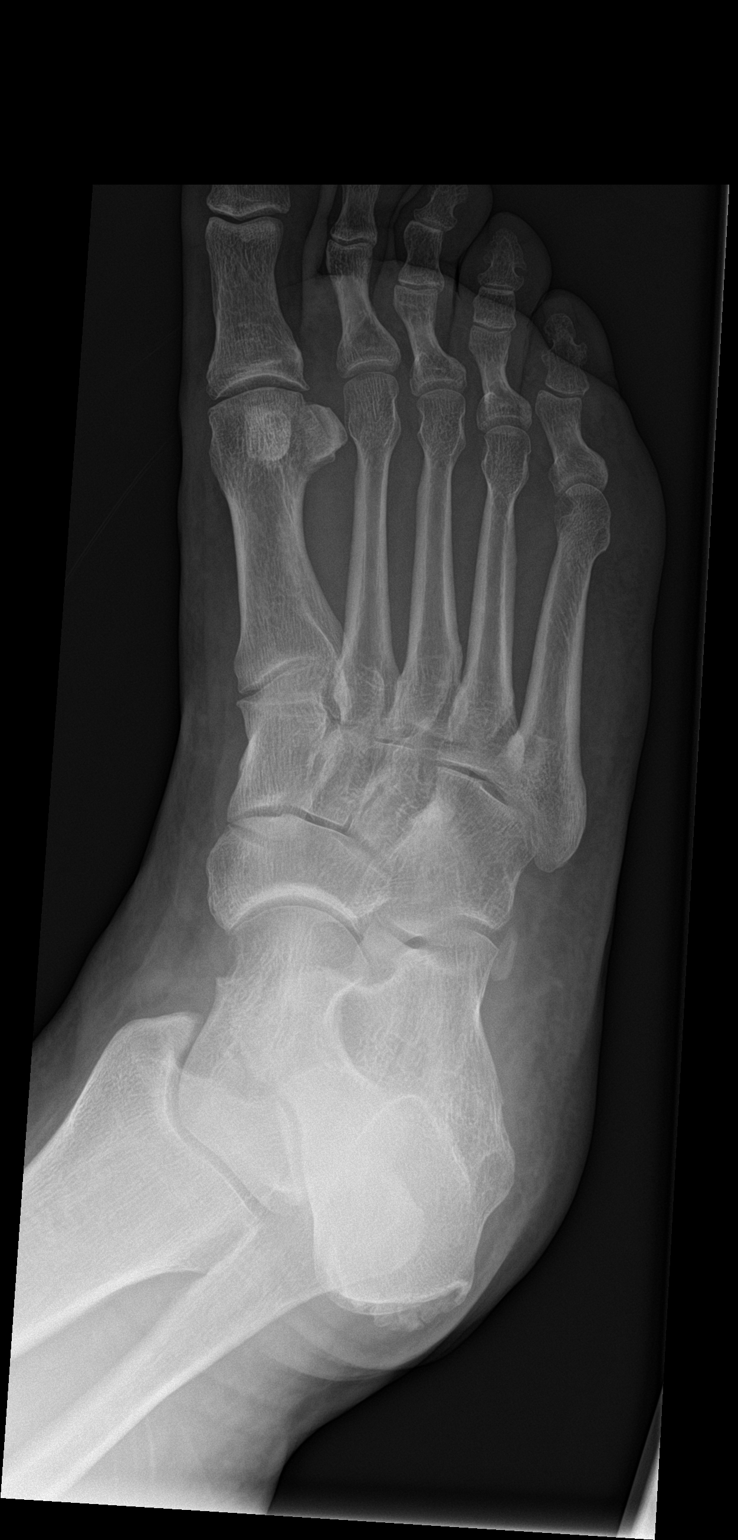

[foot lat]
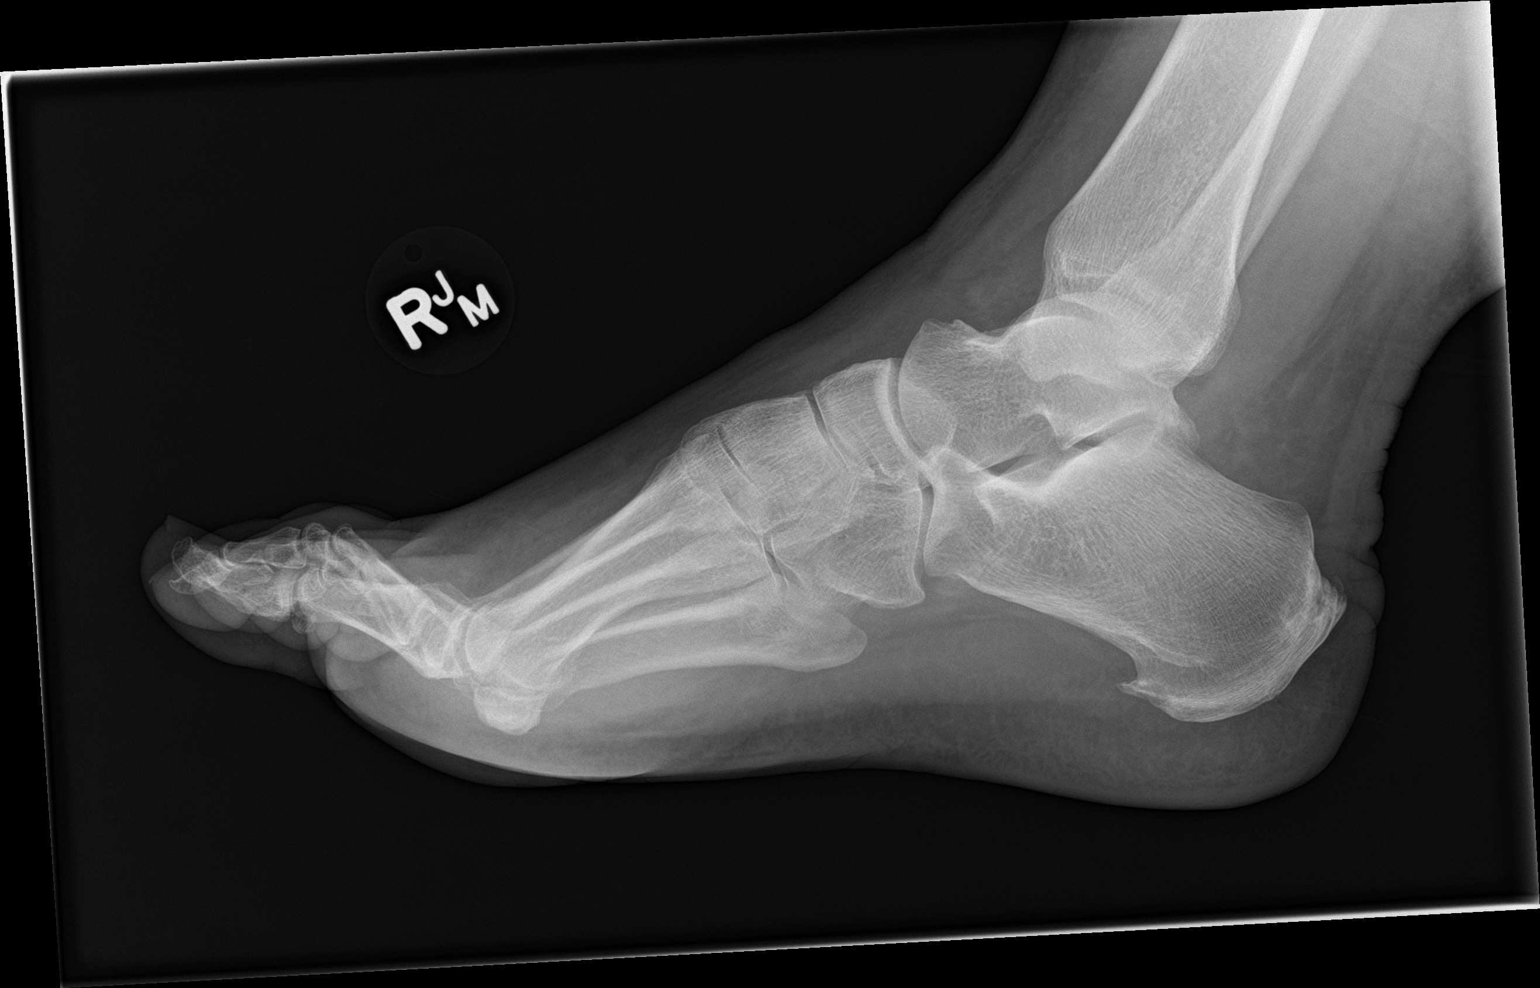

[3 of 3 positions shown; findings below may reference images not displayed]

FINDINGS: No acute fracture or dislocation is noted. No gross soft tissue
abnormality is seen. Calcaneal spurring is noted.
IMPRESSION: No acute abnormality noted.

## 2019-08-01 DIAGNOSIS — I10 Essential (primary) hypertension: Secondary | ICD-10-CM | POA: Insufficient documentation

## 2019-08-09 ENCOUNTER — Other Ambulatory Visit: Payer: Self-pay

## 2019-08-09 ENCOUNTER — Ambulatory Visit (INDEPENDENT_AMBULATORY_CARE_PROVIDER_SITE_OTHER): Payer: Managed Care, Other (non HMO) | Admitting: Medical

## 2019-08-09 ENCOUNTER — Encounter: Payer: Self-pay | Admitting: Medical

## 2019-08-09 VITALS — BP 119/75 | HR 82 | Temp 97.1°F | Resp 12 | Ht 71.0 in | Wt 272.2 lb

## 2019-08-09 DIAGNOSIS — E118 Type 2 diabetes mellitus with unspecified complications: Secondary | ICD-10-CM | POA: Diagnosis not present

## 2019-08-09 DIAGNOSIS — I10 Essential (primary) hypertension: Secondary | ICD-10-CM | POA: Diagnosis not present

## 2019-08-09 DIAGNOSIS — E785 Hyperlipidemia, unspecified: Secondary | ICD-10-CM | POA: Diagnosis not present

## 2019-08-09 DIAGNOSIS — Z113 Encounter for screening for infections with a predominantly sexual mode of transmission: Secondary | ICD-10-CM | POA: Diagnosis not present

## 2019-08-09 LAB — COMPREHENSIVE METABOLIC PANEL
ALT: 60 U/L — ABNORMAL HIGH (ref 0–53)
AST: 34 U/L (ref 0–37)
Albumin: 4.2 g/dL (ref 3.5–5.2)
Alkaline Phosphatase: 67 U/L (ref 39–117)
BUN: 13 mg/dL (ref 6–23)
CO2: 25 mEq/L (ref 19–32)
Calcium: 8.9 mg/dL (ref 8.4–10.5)
Chloride: 102 mEq/L (ref 96–112)
Creatinine, Ser: 1.01 mg/dL (ref 0.40–1.50)
GFR: 78.14 mL/min (ref >60.00)
Glucose, Bld: 157 mg/dL — ABNORMAL HIGH (ref 70–99)
Potassium: 3.8 mEq/L (ref 3.5–5.1)
Sodium: 135 mEq/L (ref 135–145)
Total Bilirubin: 0.5 mg/dL (ref 0.2–1.2)
Total Protein: 6.9 g/dL (ref 6.0–8.3)

## 2019-08-09 LAB — LIPID PANEL
Cholesterol: 127 mg/dL (ref 0–200)
HDL: 30 mg/dL — ABNORMAL LOW (ref >39.00)
LDL Cholesterol: 66 mg/dL (ref 0–99)
NonHDL: 97.09
Total CHOL/HDL Ratio: 4
Triglycerides: 155 mg/dL — ABNORMAL HIGH (ref 0.0–149.0)
VLDL: 31 mg/dL (ref 0.0–40.0)

## 2019-08-09 LAB — HEMOGLOBIN A1C: Hgb A1c MFr Bld: 7.8 % — ABNORMAL HIGH (ref 4.6–6.5)

## 2019-08-09 NOTE — Patient Instructions (Signed)
For htn, continue current med regimen.  For diabetes will get cmp and a1c. See if need to make adjustment on med regimen.  For high cholesterol continue current meds and will check lipid panel.  Follow up in 3 months or as needed

## 2019-08-09 NOTE — Progress Notes (Signed)
Subjective:    Patient ID: Anthony Zamora, male    DOB: 03-22-1971, 48 y.o.   MRN: [id number]  HPI  Pt in today for follow up for diabetes, htn and high cholesterol.   Regarding htn his bp is well controlled. On hctz and losartan  Last a1c was 7.6. Pt is on metformin and januvia.   For high cholesterol he is on lipitor and fenofibrate.  Pt states describes diet compliance for most part.  Pt scheduled for colonoscopy jan 2021.    Review of Systems  Constitutional: Negative for chills, fatigue and fever.  HENT: Negative for congestion, ear pain, sinus pressure, sinus pain and sneezing.   Respiratory: Negative for cough, chest tightness and wheezing.   Cardiovascular: Negative for chest pain and palpitations.  Gastrointestinal: Negative for abdominal pain, diarrhea and vomiting.  Genitourinary: Negative for dysuria, flank pain and hematuria.  Musculoskeletal: Negative for back pain, myalgias and neck stiffness.  Skin: Negative for rash.  Neurological: Negative for dizziness and headaches.  Hematological: Negative for adenopathy. Does not bruise/bleed easily.  Psychiatric/Behavioral: Negative for behavioral problems, confusion and sleep disturbance. The patient is not nervous/anxious.     Past Medical History:  Diagnosis Date  . Hyperlipidemia   . Hypertension      Social History   Socioeconomic History  . Marital status: Married    Spouse name: Not on file  . Number of children: Not on file  . Years of education: Not on file  . Highest education level: Not on file  Occupational History  . Not on file  Tobacco Use  . Smoking status: Never Smoker  . Smokeless tobacco: Never Used  Substance and Sexual Activity  . Alcohol use: Not Currently  . Drug use: Never  . Sexual activity: Yes  Other Topics Concern  . Not on file  Social History Narrative  . Not on file   Social Determinants of Health   Financial Resource Strain:   . Difficulty of Paying Living  Expenses: Not on file  Food Insecurity:   . Worried About Charity fundraiser in the Last Year: Not on file  . Ran Out of Food in the Last Year: Not on file  Transportation Needs:   . Lack of Transportation (Medical): Not on file  . Lack of Transportation (Non-Medical): Not on file  Physical Activity:   . Days of Exercise per Week: Not on file  . Minutes of Exercise per Session: Not on file  Stress:   . Feeling of Stress : Not on file  Social Connections:   . Frequency of Communication with Friends and Family: Not on file  . Frequency of Social Gatherings with Friends and Family: Not on file  . Attends Religious Services: Not on file  . Active Member of Clubs or Organizations: Not on file  . Attends Archivist Meetings: Not on file  . Marital Status: Not on file  Intimate Partner Violence:   . Fear of Current or Ex-Partner: Not on file  . Emotionally Abused: Not on file  . Physically Abused: Not on file  . Sexually Abused: Not on file    Past Surgical History:  Procedure Laterality Date  . TONSILLECTOMY      Family History  Problem Relation Age of Onset  . Diabetes Father   . Kidney disease Father     Not on File  Current Outpatient Medications on File Prior to Visit  Medication Sig Dispense Refill  . atorvastatin (LIPITOR) 10  MG tablet Take 1 tablet (10 mg total) by mouth daily. 30 tablet 3  . fenofibrate 54 MG tablet TAKE 1 TABLET BY MOUTH DAILY 90 tablet 2  . hydrochlorothiazide (MICROZIDE) 12.5 MG capsule TAKE 1 CAPSULE BY MOUTH EVERY DAY 90 capsule 2  . losartan (COZAAR) 25 MG tablet TAKE 1 TABLET BY MOUTH EVERY DAY 90 tablet 0  . metFORMIN (GLUCOPHAGE) 1000 MG tablet Take 1 tablet (1,000 mg total) by mouth 2 (two) times daily with a meal. 180 tablet 3  . sitaGLIPtin (JANUVIA) 100 MG tablet Take 1 tablet (100 mg total) by mouth daily. 90 tablet 1   No current facility-administered medications on file prior to visit.    BP 119/75 (BP Location: Right  Arm, Cuff Size: Large)   Pulse 82   Temp (!) 97.1 F (36.2 C) (Temporal)   Resp 12   Ht 5\' 11"  (1.803 m)   Wt 272 lb 3.2 oz (123.5 kg)   SpO2 98%   BMI 37.96 kg/m       Objective:   Physical Exam  General Mental Status- Alert. General Appearance- Not in acute distress.   Skin General: Color- Normal Color. Moisture- Normal Moisture.  Neck Carotid Arteries- Normal color. Moisture- Normal Moisture. No carotid bruits. No JVD.  Chest and Lung Exam Auscultation: Breath Sounds:-Normal.  Cardiovascular Auscultation:Rythm- Regular. Murmurs & Other Heart Sounds:Auscultation of the heart reveals- No Murmurs.  Abdomen Inspection:-Inspeection Normal. Palpation/Percussion:Note:No mass. Palpation and Percussion of the abdomen reveal- Non Tender, Non Distended + BS, no rebound or guarding.   Neurologic Cranial Nerve exam:- CN III-XII intact(No nystagmus), symmetric smile. Strength:- 5/5 equal and symmetric strength both upper and lower extremities.  Lower -see quality metrics.      Assessment & Plan:  For htn, continue current med regimen.  For diabetes will get cmp and a1c. See if need to make adjustment on med regimen.  For high cholesterol continue current meds and will check lipid panel.  Follow up in 3 months or as needed  , [employer]

## 2019-08-10 ENCOUNTER — Ambulatory Visit: Payer: Self-pay

## 2019-08-10 LAB — HIV ANTIBODY (ROUTINE TESTING W REFLEX): HIV 1&2 Ab, 4th Generation: NONREACTIVE

## 2019-08-10 NOTE — Telephone Encounter (Signed)
Incoming call from Patient wife complaining of Patient having a fever of 103. States that temperature has been fluctuating all week .  This is only SX.  Right now.  Reviewed protocol and Recommending that Patient go to ED or Urgent care.  Wife voices understanding.          Reason for Disposition . Fever > 103 F (39.4 C)  Answer Assessment - Initial Assessment Questions 1. COVID-19 DIAGNOSIS: "Who made your Coronavirus (COVID-19) diagnosis?" "Was it confirmed by a positive lab test?" If not diagnosed by a HCP, ask "Are there lots of cases (community spread) where you live?" (See public health department website, if unsure)     *No Answer* 2. COVID-19 EXPOSURE: "Was there any known exposure to [place] before the symptoms began?" CDC Definition of close contact: within 6 feet (2 meters) for a total of 15 minutes or more over a 24-hour period.      *No Answer* 3. ONSET: "When did the COVID-19 symptoms start?"     Monday 4. WORST SYMPTOM: "What is your worst symptom?" (e.g., cough, fever, shortness of breath, muscle aches)      5. COUGH: "Do you have a cough?" If so, ask: "How bad is the cough?"      Every now and then  6. FEVER: "Do you have a fever?" If so, ask: "What is your temperature, how was it measured, and when did it start?"     103 7. RESPIRATORY STATUS: "Describe your breathing?" (e.g., shortness of breath, wheezing, unable to speak)      denies 8. BETTER-SAME-WORSE: "Are you getting better, staying the same or getting worse compared to yesterday?"  If getting worse, ask, "In what way?"    worse 9. HIGH RISK DISEASE: "Do you have any chronic medical problems?" (e.g., asthma, heart or lung disease, weak immune system, obesity, etc.)    denies 10. PREGNANCY: "Is there any chance you are pregnant?" "When was your last menstrual period?"       11. OTHER SYMPTOMS: "Do you have any other symptoms?"  (e.g., chills, fatigue, headache, loss of smell or taste, muscle pain, sore throat;  new loss of smell or taste especially support the diagnosis of COVID-19)      Headache  Protocols used: CORONAVIRUS (COVID-19) DIAGNOSED OR SUSPECTED-A-AH

## 2019-08-13 ENCOUNTER — Ambulatory Visit (AMBULATORY_SURGERY_CENTER): Payer: Managed Care, Other (non HMO) | Admitting: *Deleted

## 2019-08-13 ENCOUNTER — Other Ambulatory Visit: Payer: Self-pay

## 2019-08-13 VITALS — Temp 96.9°F | Ht 71.0 in | Wt 265.0 lb

## 2019-08-13 DIAGNOSIS — Z1211 Encounter for screening for malignant neoplasm of colon: Secondary | ICD-10-CM

## 2019-08-13 DIAGNOSIS — Z1159 Encounter for screening for other viral diseases: Secondary | ICD-10-CM

## 2019-08-13 MED ORDER — SUPREP BOWEL PREP KIT 17.5-3.13-1.6 GM/177ML PO SOLN: 1.0000 | Freq: Once | ORAL | 0 refills | Status: AC

## 2019-08-13 NOTE — Progress Notes (Signed)
No egg or soy allergy known to patient  No issues with past sedation with any surgeries  or procedures, no intubation problems  No diet pills per patient No home 02 use per patient  No blood thinners per patient  Pt denies issues with constipation  No A fib or A flutter  EMMI video sent to pt's e mail   Due to the COVID-19 pandemic we are asking patients to follow these guidelines. Please only bring one care partner. Please be aware that your care partner may wait in the car in the parking lot or if they feel like they will be too hot to wait in the car, they may wait in the lobby on the 4th floor. All care partners are required to wear a mask the entire time (we do not have any that we can provide them), they need to practice social distancing, and we will do a Covid check for all patient's and care partners when you arrive. Also we will check their temperature and your temperature. If the care partner waits in their car they need to stay in the parking lot the entire time and we will call them on their cell phone when the patient is ready for discharge so they can bring the car to the front of the building. Also all patient's will need to wear a mask into building. Suprep $15  

## 2019-08-20 ENCOUNTER — Telehealth: Payer: Self-pay | Admitting: Medical

## 2019-08-20 NOTE — Telephone Encounter (Signed)
Pt has appointment tomorrow. But I think it states in office. On review of recent ED evualuation 10 days ago he may have covid.   Call pt and verify. He needs virtual visit rather than in office.  Need 02 sat monitor, temp, blood pressure for virtual visit. In light of recent pneumonia 02 sat very important

## 2019-08-20 NOTE — Telephone Encounter (Signed)
Opened to review 

## 2019-08-20 NOTE — Telephone Encounter (Signed)
Pt has appointment tomorrow. But I think it states in office. On review of recent ED evualuation 10 days ago he may have covid.   Call pt and verify. He needs virtual visit rather than in office.  Need 02 sat monitor, temp, blood pressure for virtual visit. In light of recent pneumonia 02 sat very important 

## 2019-08-21 ENCOUNTER — Other Ambulatory Visit: Payer: Self-pay

## 2019-08-21 ENCOUNTER — Ambulatory Visit (INDEPENDENT_AMBULATORY_CARE_PROVIDER_SITE_OTHER): Payer: Managed Care, Other (non HMO) | Admitting: Medical

## 2019-08-21 ENCOUNTER — Encounter: Payer: Self-pay | Admitting: Medical

## 2019-08-21 DIAGNOSIS — U071 COVID-19: Secondary | ICD-10-CM

## 2019-08-21 DIAGNOSIS — J189 Pneumonia, unspecified organism: Secondary | ICD-10-CM | POA: Diagnosis not present

## 2019-08-21 NOTE — Progress Notes (Signed)
Subjective:    Patient ID: Anthony Zamora, male    DOB: 11/07/1970, 48 y.o.   MRN: [id number]  HPI  Virtual Visit via Telephone Note  I connected with Anthony Zamora on 08/21/19 at  8:00 AM EST by telephone and verified that I am speaking with the correct person using two identifiers.  Location: Patient: car Provider: office   I discussed the limitations, risks, security and privacy concerns of performing an evaluation and management service by telephone and the availability of in person appointments. I also discussed with the patient that there may be a patient responsible charge related to this service. The patient expressed understanding and agreed to proceed.   History of Present Illness: Pt states dx with covid and told had pneumonia in ED(on Dec 18,2020)  Pt states not coughing up anything. Pt 02 sat was 96% today.  In ED was in high 80's.   Pt states feels great now with no symptoms. Over last 2-3 days.   He states when he was sick mostly had severe fatigue.    Observations/Objective: General- no acute distress. Pleasant, alert and oriented. Normal speech.  Assessment and Plan: You had recent covid with pneumonia. Presently stating that you feel great with no signs or symptoms. Today appears to be day 11 after + test. Your have been on azithromycin. Recommend that you get cxr on this Friday to see if any residual pneumonia present. If so would extend antibiotic. Provided still clinically feeling well and xray shows improvement will write return to work note for Monday.(which would be about 18 days post + test and more days since onset of symptoms initially).  Follow up with me to be determined pending cxr result  Esperanza Richters, PA-C  Follow Up Instructions:    I discussed the assessment and treatment plan with the patient. The patient was provided an opportunity to ask questions and all were answered. The patient agreed with the plan and demonstrated an  understanding of the instructions.   The patient was advised to call back or seek an in-person evaluation if the symptoms worsen or if the condition fails to improve as anticipated.  I provided 15 minutes of non-face-to-face time during this encounter.   Esperanza Richters, PA-C    Review of Systems  Constitutional: Negative for chills, fatigue and fever.  HENT: Negative for congestion and ear pain.   Respiratory: Negative for cough, chest tightness, shortness of breath and wheezing.   Cardiovascular: Negative for chest pain and palpitations.  Gastrointestinal: Negative for abdominal pain.  Musculoskeletal: Negative for back pain.  Skin: Negative for rash.  Neurological: Negative for dizziness, speech difficulty, weakness, numbness and headaches.  Hematological: Negative for adenopathy. Does not bruise/bleed easily.  Psychiatric/Behavioral: Negative for behavioral problems, confusion and suicidal ideas. The patient is not nervous/anxious.     Past Medical History:  Diagnosis Date  . Diabetes mellitus without complication (HCC)   . Hyperlipidemia   . Hypertension      Social History   Socioeconomic History  . Marital status: Married    Spouse name: Not on file  . Number of children: Not on file  . Years of education: Not on file  . Highest education level: Not on file  Occupational History  . Not on file  Tobacco Use  . Smoking status: Never Smoker  . Smokeless tobacco: Never Used  Substance and Sexual Activity  . Alcohol use: Not Currently  . Drug use: Never  . Sexual activity: Yes  Other  Topics Concern  . Not on file  Social History Narrative  . Not on file   Social Determinants of Health   Financial Resource Strain:   . Difficulty of Paying Living Expenses: Not on file  Food Insecurity:   . Worried About Charity fundraiser in the Last Year: Not on file  . Ran Out of Food in the Last Year: Not on file  Transportation Needs:   . Lack of Transportation  (Medical): Not on file  . Lack of Transportation (Non-Medical): Not on file  Physical Activity:   . Days of Exercise per Week: Not on file  . Minutes of Exercise per Session: Not on file  Stress:   . Feeling of Stress : Not on file  Social Connections:   . Frequency of Communication with Friends and Family: Not on file  . Frequency of Social Gatherings with Friends and Family: Not on file  . Attends Religious Services: Not on file  . Active Member of Clubs or Organizations: Not on file  . Attends Archivist Meetings: Not on file  . Marital Status: Not on file  Intimate Partner Violence:   . Fear of Current or Ex-Partner: Not on file  . Emotionally Abused: Not on file  . Physically Abused: Not on file  . Sexually Abused: Not on file    Past Surgical History:  Procedure Laterality Date  . TONSILLECTOMY    . WISDOM TOOTH EXTRACTION      Family History  Problem Relation Age of Onset  . Diabetes Father   . Kidney disease Father   . Colon cancer Neg Hx   . Colon polyps Neg Hx   . Esophageal cancer Neg Hx   . Rectal cancer Neg Hx   . Stomach cancer Neg Hx     No Known Allergies  Current Outpatient Medications on File Prior to Visit  Medication Sig Dispense Refill  . atorvastatin (LIPITOR) 10 MG tablet Take 1 tablet (10 mg total) by mouth daily. 30 tablet 3  . fenofibrate 54 MG tablet TAKE 1 TABLET BY MOUTH DAILY 90 tablet 2  . hydrochlorothiazide (MICROZIDE) 12.5 MG capsule TAKE 1 CAPSULE BY MOUTH EVERY DAY 90 capsule 2  . losartan (COZAAR) 25 MG tablet TAKE 1 TABLET BY MOUTH EVERY DAY 90 tablet 0  . metFORMIN (GLUCOPHAGE) 1000 MG tablet Take 1 tablet (1,000 mg total) by mouth 2 (two) times daily with a meal. 180 tablet 3  . sitaGLIPtin (JANUVIA) 100 MG tablet Take 1 tablet (100 mg total) by mouth daily. (Patient not taking: Reported on 08/13/2019) 90 tablet 1   No current facility-administered medications on file prior to visit.    There were no vitals taken  for this visit.      Objective:   Physical Exam        Assessment & Plan:

## 2019-08-21 NOTE — Patient Instructions (Signed)
You had recent covid with pneumonia. Presently stating that you feel great with no signs or symptoms. Today appears to be day 11 after + test. Your have been on azithromycin. Recommend that you get cxr on this Friday to see if any residual pneumonia present. If so would extend antibiotic. Provided still clinically feeling well and xray shows improvement will write return to work note for Monday.(which would be about 18 days post + test and more days since onset of symptoms initially).  Follow up with me to be determined pending cxr result

## 2019-08-27 ENCOUNTER — Encounter: Payer: Self-pay | Admitting: Gastroenterology

## 2019-08-31 ENCOUNTER — Encounter: Payer: Managed Care, Other (non HMO) | Admitting: Gastroenterology

## 2019-09-15 ENCOUNTER — Other Ambulatory Visit: Payer: Self-pay | Admitting: Medical

## 2019-09-20 ENCOUNTER — Ambulatory Visit (INDEPENDENT_AMBULATORY_CARE_PROVIDER_SITE_OTHER): Payer: Managed Care, Other (non HMO)

## 2019-09-20 ENCOUNTER — Other Ambulatory Visit: Payer: Self-pay | Admitting: Gastroenterology

## 2019-09-20 DIAGNOSIS — Z1159 Encounter for screening for other viral diseases: Secondary | ICD-10-CM

## 2019-09-20 LAB — SARS CORONAVIRUS 2 (TAT 6-24 HRS): SARS Coronavirus 2: NEGATIVE

## 2019-09-24 ENCOUNTER — Telehealth: Payer: Self-pay | Admitting: Gastroenterology

## 2019-09-24 NOTE — Telephone Encounter (Signed)
I have attempted to reach the pt's wife several times and have only reached her voicemail.   I tried to reach pt= LMOM to call us back.

## 2019-09-24 NOTE — Telephone Encounter (Signed)
Spoke with pt and he is on his way to pick up Suprep sample

## 2019-09-24 NOTE — Telephone Encounter (Signed)
LMOM that we could leave a Suprep sample on the third floor for pt to pick up.  Asked her to call back if that will work for her

## 2019-09-24 NOTE — Telephone Encounter (Signed)
Pt is scheduled for a colonoscopy tomorrow and wife stated that insurance does not cover Suprep.

## 2019-09-25 ENCOUNTER — Encounter: Payer: Self-pay | Admitting: Gastroenterology

## 2019-09-25 ENCOUNTER — Other Ambulatory Visit: Payer: Self-pay

## 2019-09-25 ENCOUNTER — Ambulatory Visit (AMBULATORY_SURGERY_CENTER): Payer: Managed Care, Other (non HMO) | Admitting: Gastroenterology

## 2019-09-25 VITALS — BP 114/85 | HR 79 | Temp 96.6°F | Resp 14 | Ht 71.0 in | Wt 265.0 lb

## 2019-09-25 DIAGNOSIS — Z1211 Encounter for screening for malignant neoplasm of colon: Secondary | ICD-10-CM | POA: Diagnosis not present

## 2019-09-25 MED ORDER — SODIUM CHLORIDE 0.9 % IV SOLN: 500.0000 mL | Freq: Once | INTRAVENOUS | Status: DC

## 2019-09-25 NOTE — Progress Notes (Signed)
To PACU, VSS. Report to [email or web address] 

## 2019-09-25 NOTE — Progress Notes (Signed)
Pt's states no medical or surgical changes since previsit or office visit. 

## 2019-09-25 NOTE — Op Note (Signed)
[facility] Patient Name: Anthony Zamora Procedure Date: 09/25/2019 8:40 AM MRN: [id number] Endoscopist: Lynann Bologna , MD Age: 49 Referring MD:  Date of Birth: 06-25-1971 Gender: Male Account #: [id number] Procedure:                Colonoscopy Indications:              Screening for colorectal malignant neoplasm Medicines:                Monitored Anesthesia Care Procedure:                Pre-Anesthesia Assessment:                           - Prior to the procedure, a History and Physical                            was performed, and patient medications and                            allergies were reviewed. The patient's tolerance of                            previous anesthesia was also reviewed. The risks                            and benefits of the procedure and the sedation                            options and risks were discussed with the patient.                            All questions were answered, and informed consent                            was obtained. Prior Anticoagulants: The patient has                            taken no previous anticoagulant or antiplatelet                            agents. ASA Grade Assessment: II - A patient with                            mild systemic disease. After reviewing the risks                            and benefits, the patient was deemed in                            satisfactory condition to undergo the procedure.                           After obtaining informed consent, the colonoscope  was passed under direct vision. Throughout the                            procedure, the patient's blood pressure, pulse, and                            oxygen saturations were monitored continuously. The                            Colonoscope was introduced through the anus and                            advanced to the the cecum, identified by                            appendiceal orifice and  ileocecal valve. The                            colonoscopy was performed without difficulty. The                            patient tolerated the procedure well. The quality                            of the bowel preparation was good. The ileocecal                            valve, appendiceal orifice, and rectum were                            photographed. Scope In: 8:57:39 AM Scope Out: 9:08:20 AM Scope Withdrawal Time: 0 hours 7 minutes 46 seconds  Total Procedure Duration: 0 hours 10 minutes 41 seconds  Findings:                 A few small-mouthed diverticula were found in the                            sigmoid colon.                           Non-bleeding internal hemorrhoids/anal tags were                            found during retroflexion. The hemorrhoids were                            small.                           The exam was otherwise without abnormality on                            direct and retroflexion views. Complications:            No immediate complications. Estimated Blood Loss:     Estimated  blood loss: none. Impression:               -Mild sigmoid diverticulosis.                           -Otherwise normal colonoscopy to TI. Recommendation:           - Patient has a contact number available for                            emergencies. The signs and symptoms of potential                            delayed complications were discussed with the                            patient. Return to normal activities tomorrow.                            Written discharge instructions were provided to the                            patient.                           - High fiber diet.                           - Continue present medications.                           - Repeat colonoscopy in 10 years for screening                            purposes. Earlier, if with any new problems or if                            any change in family history.                            - Return to GI office PRN.                           - D/W Brita Romp, MD 09/25/2019 9:14:06 AM This report has been signed electronically.

## 2019-09-25 NOTE — Patient Instructions (Signed)
Please see handouts given to you on Diverticulosis, High Fiber Diet and Hemorrhoids.  Thank you for letting us take care of your healthcare needs today.   YOU HAD AN ENDOSCOPIC PROCEDURE TODAY AT THE [facility]:   Refer to the procedure report that was given to you for any specific questions about what was found during the examination.  If the procedure report does not answer your questions, please call your gastroenterologist to clarify.  If you requested that your care partner not be given the details of your procedure findings, then the procedure report has been included in a sealed envelope for you to review at your convenience later.  YOU SHOULD EXPECT: Some feelings of bloating in the abdomen. Passage of more gas than usual.  Walking can help get rid of the air that was put into your GI tract during the procedure and reduce the bloating. If you had a lower endoscopy (such as a colonoscopy or flexible sigmoidoscopy) you may notice spotting of blood in your stool or on the toilet paper. If you underwent a bowel prep for your procedure, you may not have a normal bowel movement for a few days.  Please Note:  You might notice some irritation and congestion in your nose or some drainage.  This is from the oxygen used during your procedure.  There is no need for concern and it should clear up in a day or so.  SYMPTOMS TO REPORT IMMEDIATELY:   Following lower endoscopy (colonoscopy or flexible sigmoidoscopy):  Excessive amounts of blood in the stool  Significant tenderness or worsening of abdominal pains  Swelling of the abdomen that is new, acute  Fever of 100F or higher   For urgent or emergent issues, a gastroenterologist can be reached at any hour by calling [phone].   DIET:  We do recommend a small meal at first, but then you may proceed to your regular diet.  Drink plenty of fluids but you should avoid alcoholic beverages for 24 hours.  ACTIVITY:  You should plan  to take it easy for the rest of today and you should NOT DRIVE or use heavy machinery until tomorrow (because of the sedation medicines used during the test).    FOLLOW UP: Our staff will call the number listed on your records 48-72 hours following your procedure to check on you and address any questions or concerns that you may have regarding the information given to you following your procedure. If we do not reach you, we will leave a message.  We will attempt to reach you two times.  During this call, we will ask if you have developed any symptoms of COVID 19. If you develop any symptoms (ie: fever, flu-like symptoms, shortness of breath, cough etc.) before then, please call [phone].  If you test positive for Covid 19 in the 2 weeks post procedure, please call and report this information to [place].    If any biopsies were taken you will be contacted by phone or by letter within the next 1-3 weeks.  Please call us at [phone] if you have not heard about the biopsies in 3 weeks.    SIGNATURES/CONFIDENTIALITY: You and/or your care partner have signed paperwork which will be entered into your electronic medical record.  These signatures attest to the fact that that the information above on your After Visit Summary has been reviewed and is understood.  Full responsibility of the confidentiality of this discharge information lies with you and/or your  care-partner.

## 2019-09-25 NOTE — Progress Notes (Signed)
Temperature taken by J.B., VS taken by D.T. 

## 2019-09-27 ENCOUNTER — Telehealth: Payer: Self-pay

## 2019-09-27 NOTE — Telephone Encounter (Signed)
  Follow up Call-  Call back number 09/25/2019  Post procedure Call Back phone  # [phone]  Permission to leave phone message Yes     Patient questions:  Do you have a fever, pain , or abdominal swelling? No. Pain Score  0 *  Have you tolerated food without any problems? Yes.    Have you been able to return to your normal activities? Yes.    Do you have any questions about your discharge instructions: Diet   No. Medications  No. Follow up visit  No.  Do you have questions or concerns about your Care? No.  Actions: * If pain score is 4 or above: No action needed, pain <4.  1. Have you developed a fever since your procedure? no  2.   Have you had an respiratory symptoms (SOB or cough) since your procedure? no  3.   Have you tested positive for COVID 19 since your procedure no  4.   Have you had any family members/close contacts diagnosed with the COVID 19 since your procedure?  no   If yes to any of these questions please route to Laverna Peace, RN and Jennye Boroughs, Charity fundraiser.

## 2019-10-01 ENCOUNTER — Encounter: Payer: Self-pay | Admitting: Medical

## 2019-10-01 MED ORDER — HYDROCHLOROTHIAZIDE 12.5 MG PO CAPS: ORAL_CAPSULE | ORAL | 2 refills | Status: DC

## 2019-11-01 ENCOUNTER — Ambulatory Visit: Payer: Managed Care, Other (non HMO)

## 2019-11-06 ENCOUNTER — Other Ambulatory Visit: Payer: Self-pay

## 2019-11-07 ENCOUNTER — Encounter: Payer: Self-pay | Admitting: Medical

## 2019-11-07 ENCOUNTER — Ambulatory Visit (INDEPENDENT_AMBULATORY_CARE_PROVIDER_SITE_OTHER): Payer: Managed Care, Other (non HMO) | Admitting: Medical

## 2019-11-07 ENCOUNTER — Other Ambulatory Visit: Payer: Self-pay

## 2019-11-07 VITALS — BP 135/81 | HR 76 | Temp 96.6°F | Resp 18 | Ht 71.0 in | Wt 271.8 lb

## 2019-11-07 DIAGNOSIS — I10 Essential (primary) hypertension: Secondary | ICD-10-CM | POA: Diagnosis not present

## 2019-11-07 DIAGNOSIS — E118 Type 2 diabetes mellitus with unspecified complications: Secondary | ICD-10-CM | POA: Diagnosis not present

## 2019-11-07 DIAGNOSIS — E781 Pure hyperglyceridemia: Secondary | ICD-10-CM

## 2019-11-07 NOTE — Patient Instructions (Signed)
Your sugar was elevated 3 months ago. Continue current meds and putting in future labs to get blood work early next week.  Bp well controlled recently. Continue current bp meds.  For high cholesterol putting in future cmp and lipid panel today. Get those labs done fasting next week.  Follow up date to be determined after lab review.

## 2019-11-07 NOTE — Progress Notes (Signed)
Subjective:    Patient ID: Anthony Zamora, male    DOB: Jun 04, 1971, 49 y.o.   MRN: [id number]  HPI  Pt in for follow up.  Last a1c showed average sugar around 157. Pt has been taking both metformin and januvia.. Pt states getting regular exercise. Work out room at work now open. Working out 3-4 days a week.    Pt has been eating better and lost 7-8 lbs.  Pt has high cholesterol and is on atorvastatin and fenofibrate.  Pt bp yesterday at work was 135/81     Review of Systems  Constitutional: Negative for chills, fatigue and fever.  HENT: Negative for congestion, ear pain, sinus pressure, sinus pain and sore throat.   Respiratory: Negative for cough, chest tightness, shortness of breath and wheezing.   Cardiovascular: Negative for chest pain and palpitations.  Gastrointestinal: Negative for abdominal pain.  Musculoskeletal: Negative for back pain and myalgias.  Skin: Negative for rash.  Neurological: Negative for dizziness, seizures, weakness and headaches.  Hematological: Negative for adenopathy. Does not bruise/bleed easily.  Psychiatric/Behavioral: Negative for behavioral problems and confusion.    Past Medical History:  Diagnosis Date  . Diabetes mellitus without complication ([place])   . Hyperlipidemia   . Hypertension      Social History   Socioeconomic History  . Marital status: Married    Spouse name: Not on file  . Number of children: Not on file  . Years of education: Not on file  . Highest education level: Not on file  Occupational History  . Not on file  Tobacco Use  . Smoking status: Never Smoker  . Smokeless tobacco: Never Used  Substance and Sexual Activity  . Alcohol use: Not Currently  . Drug use: Never  . Sexual activity: Yes  Other Topics Concern  . Not on file  Social History Narrative  . Not on file   Social Determinants of Health   Financial Resource Strain:   . Difficulty of Paying Living Expenses:   Food Insecurity:   . Worried  About Charity fundraiser in the Last Year:   . Arboriculturist in the Last Year:   Transportation Needs:   . Film/video editor (Medical):   Marland Kitchen Lack of Transportation (Non-Medical):   Physical Activity:   . Days of Exercise per Week:   . Minutes of Exercise per Session:   Stress:   . Feeling of Stress :   Social Connections:   . Frequency of Communication with Friends and Family:   . Frequency of Social Gatherings with Friends and Family:   . Attends Religious Services:   . Active Member of Clubs or Organizations:   . Attends Archivist Meetings:   Marland Kitchen Marital Status:   Intimate Partner Violence:   . Fear of Current or Ex-Partner:   . Emotionally Abused:   Marland Kitchen Physically Abused:   . Sexually Abused:     Past Surgical History:  Procedure Laterality Date  . TONSILLECTOMY    . WISDOM TOOTH EXTRACTION      Family History  Problem Relation Age of Onset  . Diabetes Father   . Kidney disease Father   . Colon cancer Neg Hx   . Colon polyps Neg Hx   . Esophageal cancer Neg Hx   . Rectal cancer Neg Hx   . Stomach cancer Neg Hx     No Known Allergies  Current Outpatient Medications on File Prior to Visit  Medication Sig Dispense  Refill  . atorvastatin (LIPITOR) 10 MG tablet Take 1 tablet (10 mg total) by mouth daily. 30 tablet 3  . fenofibrate 54 MG tablet TAKE 1 TABLET BY MOUTH DAILY 90 tablet 2  . hydrochlorothiazide (MICROZIDE) 12.5 MG capsule TAKE 1 CAPSULE BY MOUTH EVERY DAY 90 capsule 2  . losartan (COZAAR) 25 MG tablet TAKE 1 TABLET BY MOUTH EVERY DAY 90 tablet 0  . metFORMIN (GLUCOPHAGE) 1000 MG tablet Take 1 tablet (1,000 mg total) by mouth 2 (two) times daily with a meal. 180 tablet 3  . sitaGLIPtin (JANUVIA) 100 MG tablet Take 1 tablet (100 mg total) by mouth daily. (Patient not taking: Reported on 08/13/2019) 90 tablet 1   No current facility-administered medications on file prior to visit.    BP 135/81   Pulse 76   Temp (!) 96.6 F (35.9 C)  (Temporal)   Resp 18   Ht 5\' 11"  (1.803 m)   Wt 271 lb 12.8 oz (123.3 kg)   SpO2 97%   BMI 37.91 kg/m       Objective:   Physical Exam  General Mental Status- Alert. General Appearance- Not in acute distress.   Skin General: Color- Normal Color. Moisture- Normal Moisture.  Neck Carotid Arteries- Normal color. Moisture- Normal Moisture. No carotid bruits. No JVD.  Chest and Lung Exam Auscultation: Breath Sounds:-Normal.  Cardiovascular Auscultation:Rythm- Regular. Murmurs & Other Heart Sounds:Auscultation of the heart reveals- No Murmurs.  Abdomen Inspection:-Inspeection Normal. Palpation/Percussion:Note:No mass. Palpation and Percussion of the abdomen reveal- Non Tender, Non Distended + BS, no rebound or guarding.    Neurologic Cranial Nerve exam:- CN III-XII intact(No nystagmus), symmetric smile. Strength:- 5/5 equal and symmetric strength both upper and lower extremities.      Assessment & Plan:  Your sugar was elevated 3 months ago. Continue current meds and putting in future labs to get blood work early next week.  Bp well controlled recently. Continue current bp meds.  For high cholesterol putting in future cmp and lipid panel today. Get those labs done fasting next week.  Follow up date to be determined after lab review.  25 minutes spent with pt today.  , PA-C

## 2019-11-15 ENCOUNTER — Other Ambulatory Visit: Payer: Managed Care, Other (non HMO)

## 2019-12-15 ENCOUNTER — Other Ambulatory Visit: Payer: Self-pay | Admitting: Medical

## 2020-02-18 ENCOUNTER — Telehealth: Payer: Self-pay | Admitting: Medical

## 2020-02-18 ENCOUNTER — Other Ambulatory Visit: Payer: Self-pay

## 2020-02-18 ENCOUNTER — Encounter: Payer: Self-pay | Admitting: Medical

## 2020-02-18 ENCOUNTER — Ambulatory Visit (INDEPENDENT_AMBULATORY_CARE_PROVIDER_SITE_OTHER): Payer: Managed Care, Other (non HMO) | Admitting: Medical

## 2020-02-18 VITALS — BP 139/89 | HR 66 | Resp 18 | Ht 71.0 in | Wt 273.6 lb

## 2020-02-18 DIAGNOSIS — E781 Pure hyperglyceridemia: Secondary | ICD-10-CM | POA: Diagnosis not present

## 2020-02-18 DIAGNOSIS — I10 Essential (primary) hypertension: Secondary | ICD-10-CM

## 2020-02-18 DIAGNOSIS — E118 Type 2 diabetes mellitus with unspecified complications: Secondary | ICD-10-CM | POA: Diagnosis not present

## 2020-02-18 LAB — COMPREHENSIVE METABOLIC PANEL
ALT: 59 U/L — ABNORMAL HIGH (ref 0–53)
AST: 22 U/L (ref 0–37)
Albumin: 4.5 g/dL (ref 3.5–5.2)
Alkaline Phosphatase: 82 U/L (ref 39–117)
BUN: 18 mg/dL (ref 6–23)
CO2: 25 mEq/L (ref 19–32)
Calcium: 9.1 mg/dL (ref 8.4–10.5)
Chloride: 102 mEq/L (ref 96–112)
Creatinine, Ser: 0.87 mg/dL (ref 0.40–1.50)
GFR: 92.63 mL/min (ref >60.00)
Glucose, Bld: 208 mg/dL — ABNORMAL HIGH (ref 70–99)
Potassium: 4 mEq/L (ref 3.5–5.1)
Sodium: 136 mEq/L (ref 135–145)
Total Bilirubin: 0.6 mg/dL (ref 0.2–1.2)
Total Protein: 6.6 g/dL (ref 6.0–8.3)

## 2020-02-18 LAB — LDL CHOLESTEROL, DIRECT: Direct LDL: 69 mg/dL

## 2020-02-18 LAB — LIPID PANEL
Cholesterol: 172 mg/dL (ref 0–200)
HDL: 35.3 mg/dL — ABNORMAL LOW (ref >39.00)
NonHDL: 136.77
Total CHOL/HDL Ratio: 5
Triglycerides: 327 mg/dL — ABNORMAL HIGH (ref 0.0–149.0)
VLDL: 65.4 mg/dL — ABNORMAL HIGH (ref 0.0–40.0)

## 2020-02-18 LAB — HEMOGLOBIN A1C: Hgb A1c MFr Bld: 8 % — ABNORMAL HIGH (ref 4.6–6.5)

## 2020-02-18 MED ORDER — SITAGLIPTIN PHOSPHATE 100 MG PO TABS: 100.0000 mg | ORAL_TABLET | Freq: Every day | ORAL | 1 refills | Status: DC

## 2020-02-18 MED ORDER — CANAGLIFLOZIN 100 MG PO TABS
100.0000 mg | ORAL_TABLET | Freq: Every day | ORAL | 11 refills | Status: DC
Start: 2020-02-18 — End: 2020-11-19

## 2020-02-18 MED ORDER — ATORVASTATIN CALCIUM 10 MG PO TABS: 10.0000 mg | ORAL_TABLET | Freq: Every day | ORAL | 3 refills | Status: DC

## 2020-02-18 NOTE — Progress Notes (Signed)
Subjective:    Patient ID: Anthony Zamora, male    DOB: 02/23/1971, 49 y.o.   MRN: [id number]  HPI  Pt in for follow up.  Last a1c showed average sugar around 157. Pt has been taking both metformin and [place] but ran out of [place] last wed. Some exercise such as cutting grass at home. Pushing mower instead of riding.   Pt has gained about 2 lbs but still toes boots on and fully clothed so possible not wt gain.  Pt has high cholesterol and is on atorvastatin and fenofibrate.  Pt bp last week when he checked 132/83.  Pt up to date on colonoscopy.   Review of Systems  Constitutional: Negative for chills, fatigue and fever.  Respiratory: Negative for cough, chest tightness, shortness of breath and wheezing.   Cardiovascular: Negative for chest pain and palpitations.  Gastrointestinal: Negative for abdominal pain, constipation and diarrhea.  Musculoskeletal: Negative for back pain and neck pain.  Skin: Negative for rash.  Hematological: Negative for adenopathy. Does not bruise/bleed easily.  Psychiatric/Behavioral: Negative for dysphoric mood and suicidal ideas. The patient is not nervous/anxious.     Past Medical History:  Diagnosis Date  . Diabetes mellitus without complication ([place])   . Hyperlipidemia   . Hypertension      Social History   Socioeconomic History  . Marital status: Married    Spouse name: Not on file  . Number of children: Not on file  . Years of education: Not on file  . Highest education level: Not on file  Occupational History  . Not on file  Tobacco Use  . Smoking status: Never Smoker  . Smokeless tobacco: Never Used  Vaping Use  . Vaping Use: Never used  Substance and Sexual Activity  . Alcohol use: Not Currently  . Drug use: Never  . Sexual activity: Yes  Other Topics Concern  . Not on file  Social History Narrative  . Not on file   Social Determinants of Health   Financial Resource Strain:   . Difficulty of Paying Living  Expenses:   Food Insecurity:   . Worried About Charity fundraiser in the Last Year:   . Arboriculturist in the Last Year:   Transportation Needs:   . Film/video editor (Medical):   Marland Kitchen Lack of Transportation (Non-Medical):   Physical Activity:   . Days of Exercise per Week:   . Minutes of Exercise per Session:   Stress:   . Feeling of Stress :   Social Connections:   . Frequency of Communication with Friends and Family:   . Frequency of Social Gatherings with Friends and Family:   . Attends Religious Services:   . Active Member of Clubs or Organizations:   . Attends Archivist Meetings:   Marland Kitchen Marital Status:   Intimate Partner Violence:   . Fear of Current or Ex-Partner:   . Emotionally Abused:   Marland Kitchen Physically Abused:   . Sexually Abused:     Past Surgical History:  Procedure Laterality Date  . TONSILLECTOMY    . WISDOM TOOTH EXTRACTION      Family History  Problem Relation Age of Onset  . Diabetes Father   . Kidney disease Father   . Colon cancer Neg Hx   . Colon polyps Neg Hx   . Esophageal cancer Neg Hx   . Rectal cancer Neg Hx   . Stomach cancer Neg Hx     No Known Allergies  Current Outpatient Medications on File Prior to Visit  Medication Sig Dispense Refill  . atorvastatin (LIPITOR) 10 MG tablet Take 1 tablet (10 mg total) by mouth daily. 30 tablet 3  . fenofibrate 54 MG tablet TAKE 1 TABLET BY MOUTH DAILY 90 tablet 2  . hydrochlorothiazide (MICROZIDE) 12.5 MG capsule TAKE 1 CAPSULE BY MOUTH EVERY DAY 90 capsule 2  . losartan (COZAAR) 25 MG tablet Take 1 tablet (25 mg total) by mouth daily. 90 tablet 0  . metFORMIN (GLUCOPHAGE) 1000 MG tablet Take 1 tablet (1,000 mg total) by mouth 2 (two) times daily with a meal. 180 tablet 3  . sitaGLIPtin (JANUVIA) 100 MG tablet Take 1 tablet (100 mg total) by mouth daily. 90 tablet 1   No current facility-administered medications on file prior to visit.    BP (!) 138/94 (BP Location: Left Arm, Patient  Position: Sitting, Cuff Size: Large)   Pulse 66   Resp 18   Ht 5\' 11"  (1.803 m)   Wt 273 lb 9.6 oz (124.1 kg)   SpO2 98%   BMI 38.16 kg/m       Objective:   Physical Exam  General Mental Status- Alert. General Appearance- Not in acute distress.   Skin General: Color- Normal Color. Moisture- Normal Moisture.  Neck Carotid Arteries- Normal color. Moisture- Normal Moisture. No carotid bruits. No JVD.  Chest and Lung Exam Auscultation: Breath Sounds:-Normal.  Cardiovascular Auscultation:Rythm- Regular. Murmurs & Other Heart Sounds:Auscultation of the heart reveals- No Murmurs.  Abdomen Inspection:-Inspeection Normal. Palpation/Percussion:Note:No mass. Palpation and Percussion of the abdomen reveal- Non Tender, Non Distended + BS, no rebound or guarding.    Neurologic Cranial Nerve exam:- CN III-XII intact(No nystagmus), symmetric smile. Strength:- 5/5 equal and symmetric strength both upper and lower extremities.      Assessment & Plan:  Your sugar was elevated 6 months ago. Continue current meds and check a1c today.  Bp well controlled recently. Continue current bp meds.  For high cholesterol get cmp and lipid panel today. Get those labs done fasting next week.  Follow up date to be determined after lab review.  , PA-C  Time spent with patient today was 25 minutes which consisted of chart review, discussing diagnosis, work up, treatment and documentation.

## 2020-02-18 NOTE — Patient Instructions (Addendum)
Your sugar was elevated 6 months ago. Continue current meds and check a1c today.  Bp well controlled recently. Continue current bp meds.  For high cholesterol get cmp and lipid panel today. Get those labs done fasting next week.  Follow up date to be determined after lab review.  Out of atorvastatin, fenofibrate and januvia for about 5 days. Will review labs then refill/possible dose change accordingly(or add on med?)

## 2020-02-18 NOTE — Telephone Encounter (Signed)
Rx atorvastatin, [place] Friendly and invokana sent to pt pharmacy.

## 2020-03-22 ENCOUNTER — Other Ambulatory Visit: Payer: Self-pay | Admitting: Medical

## 2020-07-01 ENCOUNTER — Other Ambulatory Visit: Payer: Self-pay | Admitting: Medical

## 2020-07-02 ENCOUNTER — Other Ambulatory Visit: Payer: Self-pay | Admitting: Medical

## 2020-08-18 ENCOUNTER — Ambulatory Visit (INDEPENDENT_AMBULATORY_CARE_PROVIDER_SITE_OTHER): Payer: Managed Care, Other (non HMO) | Admitting: Medical

## 2020-08-18 ENCOUNTER — Encounter: Payer: Self-pay | Admitting: Medical

## 2020-08-18 ENCOUNTER — Other Ambulatory Visit: Payer: Self-pay

## 2020-08-18 VITALS — BP 140/75 | HR 64 | Resp 16 | Ht 71.0 in | Wt 274.0 lb

## 2020-08-18 DIAGNOSIS — M545 Low back pain, unspecified: Secondary | ICD-10-CM

## 2020-08-18 DIAGNOSIS — M549 Dorsalgia, unspecified: Secondary | ICD-10-CM

## 2020-08-18 DIAGNOSIS — R109 Unspecified abdominal pain: Secondary | ICD-10-CM | POA: Diagnosis not present

## 2020-08-18 DIAGNOSIS — E781 Pure hyperglyceridemia: Secondary | ICD-10-CM | POA: Diagnosis not present

## 2020-08-18 DIAGNOSIS — E119 Type 2 diabetes mellitus without complications: Secondary | ICD-10-CM

## 2020-08-18 DIAGNOSIS — I10 Essential (primary) hypertension: Secondary | ICD-10-CM

## 2020-08-18 DIAGNOSIS — Z125 Encounter for screening for malignant neoplasm of prostate: Secondary | ICD-10-CM | POA: Diagnosis not present

## 2020-08-18 LAB — COMPREHENSIVE METABOLIC PANEL
ALT: 42 U/L (ref 0–53)
AST: 18 U/L (ref 0–37)
Albumin: 4.4 g/dL (ref 3.5–5.2)
Alkaline Phosphatase: 68 U/L (ref 39–117)
BUN: 15 mg/dL (ref 6–23)
CO2: 24 mEq/L (ref 19–32)
Calcium: 9 mg/dL (ref 8.4–10.5)
Chloride: 103 mEq/L (ref 96–112)
Creatinine, Ser: 0.75 mg/dL (ref 0.40–1.50)
GFR: 104.73 mL/min (ref >60.00)
Glucose, Bld: 188 mg/dL — ABNORMAL HIGH (ref 70–99)
Potassium: 4 mEq/L (ref 3.5–5.1)
Sodium: 136 mEq/L (ref 135–145)
Total Bilirubin: 0.5 mg/dL (ref 0.2–1.2)
Total Protein: 6.7 g/dL (ref 6.0–8.3)

## 2020-08-18 LAB — POC URINALSYSI DIPSTICK (AUTOMATED)
Bilirubin, UA: NEGATIVE
Blood, UA: NEGATIVE
Glucose, UA: POSITIVE — AB
Ketones, UA: NEGATIVE
Leukocytes, UA: NEGATIVE
Nitrite, UA: NEGATIVE
Protein, UA: POSITIVE — AB
Spec Grav, UA: 1.025 (ref 1.010–1.025)
Urobilinogen, UA: 0.2 E.U./dL
pH, UA: 6 (ref 5.0–8.0)

## 2020-08-18 LAB — CBC WITH DIFFERENTIAL/PLATELET
Basophils Absolute: 0 10*3/uL (ref 0.0–0.1)
Basophils Relative: 0.3 % (ref 0.0–3.0)
Eosinophils Absolute: 0.1 10*3/uL (ref 0.0–0.7)
Eosinophils Relative: 1.5 % (ref 0.0–5.0)
HCT: 43.7 % (ref 39.0–52.0)
Hemoglobin: 14.9 g/dL (ref 13.0–17.0)
Lymphocytes Relative: 32.6 % (ref 12.0–46.0)
Lymphs Abs: 1.8 10*3/uL (ref 0.7–4.0)
MCHC: 34.2 g/dL (ref 30.0–36.0)
MCV: 82.4 fl (ref 78.0–100.0)
Monocytes Absolute: 0.4 10*3/uL (ref 0.1–1.0)
Monocytes Relative: 7.9 % (ref 3.0–12.0)
Neutro Abs: 3.3 10*3/uL (ref 1.4–7.7)
Neutrophils Relative %: 57.7 % (ref 43.0–77.0)
Platelets: 166 10*3/uL (ref 150.0–400.0)
RBC: 5.3 Mil/uL (ref 4.22–5.81)
RDW: 14.2 % (ref 11.5–15.5)
WBC: 5.6 10*3/uL (ref 4.0–10.5)

## 2020-08-18 LAB — LIPASE: Lipase: 19 U/L (ref 11.0–59.0)

## 2020-08-18 LAB — HEMOGLOBIN A1C: Hgb A1c MFr Bld: 8.5 % — ABNORMAL HIGH (ref 4.6–6.5)

## 2020-08-18 LAB — LIPID PANEL
Cholesterol: 188 mg/dL (ref 0–200)
HDL: 41.1 mg/dL (ref >39.00)
NonHDL: 147.18
Total CHOL/HDL Ratio: 5
Triglycerides: 360 mg/dL — ABNORMAL HIGH (ref 0.0–149.0)
VLDL: 72 mg/dL — ABNORMAL HIGH (ref 0.0–40.0)

## 2020-08-18 LAB — LDL CHOLESTEROL, DIRECT: Direct LDL: 76 mg/dL

## 2020-08-18 LAB — PSA: PSA: 0.54 ng/mL (ref 0.10–4.00)

## 2020-08-18 NOTE — Progress Notes (Signed)
Subjective:    Patient ID: Anthony Zamora, male    DOB: 1971-07-13, 49 y.o.   MRN: [id number]  HPI  Pt states he has had some left side back pain and seems to radiate toward toward his umbilical area on and off. Pain states on past Monday. No pain shooting to his legs. No blood in urine. No frequent urination. No rash on his back.  Pt states has throbbing pain all the time.   Pt states at night pain is more obvious at night. Pain level 3-4/10 now. At night pain 7-8/10. Pt took ibuprofen this morning 800 mg. At night sleeping in recliner more comfortable.  History of diabetes and A1c has not been done in about 6 months.  History of hyperlipidemia.  Patient is fasting so we will go ahead and check lipid panel today.  History of hypertension.  On losartan HCTZ.  BP was initially low but high but better on recheck.    Review of Systems  Constitutional: Negative for chills, fatigue and fever.  Respiratory: Negative for cough, chest tightness, shortness of breath and wheezing.   Cardiovascular: Negative for chest pain and palpitations.  Gastrointestinal: Negative for blood in stool, diarrhea, nausea and vomiting.  Genitourinary: Negative for difficulty urinating, dysuria, frequency, penile pain, scrotal swelling, testicular pain and urgency.  Musculoskeletal: Positive for back pain.       Back pain most prominent.  Skin: Negative for rash.  Neurological: Negative for dizziness, facial asymmetry, speech difficulty, weakness and numbness.  Hematological: Negative for adenopathy. Does not bruise/bleed easily.  Psychiatric/Behavioral: Negative for behavioral problems and decreased concentration.    Past Medical History:  Diagnosis Date   Diabetes mellitus without complication ([facility])    Hyperlipidemia    Hypertension      Social History   Socioeconomic History   Marital status: Married    Spouse name: Not on file   Number of children: Not on file   Years of education: Not  on file   Highest education level: Not on file  Occupational History   Not on file  Tobacco Use   Smoking status: Never Smoker   Smokeless tobacco: Never Used  Vaping Use   Vaping Use: Never used  Substance and Sexual Activity   Alcohol use: Not Currently   Drug use: Never   Sexual activity: Yes  Other Topics Concern   Not on file  Social History Narrative   Not on file   Social Determinants of Health   Financial Resource Strain: Not on file  Food Insecurity: Not on file  Transportation Needs: Not on file  Physical Activity: Not on file  Stress: Not on file  Social Connections: Not on file  Intimate Partner Violence: Not on file    Past Surgical History:  Procedure Laterality Date   TONSILLECTOMY     WISDOM TOOTH EXTRACTION      Family History  Problem Relation Age of Onset   Diabetes Father    Kidney disease Father    Colon cancer Neg Hx    Colon polyps Neg Hx    Esophageal cancer Neg Hx    Rectal cancer Neg Hx    Stomach cancer Neg Hx     No Known Allergies  Current Outpatient Medications on File Prior to Visit  Medication Sig Dispense Refill   atorvastatin (LIPITOR) 10 MG tablet Take 1 tablet (10 mg total) by mouth daily. 90 tablet 3   canagliflozin (INVOKANA) 100 MG TABS tablet Take 1 tablet (100  mg total) by mouth daily before breakfast. 30 tablet 11   fenofibrate 54 MG tablet TAKE 1 TABLET BY MOUTH DAILY 90 tablet 2   hydrochlorothiazide (MICROZIDE) 12.5 MG capsule TAKE 1 CAPSULE BY MOUTH EVERY DAY 90 capsule 2   losartan (COZAAR) 25 MG tablet TAKE 1 TABLET BY MOUTH EVERY DAY 90 tablet 0   metFORMIN (GLUCOPHAGE) 1000 MG tablet Take 1 tablet (1,000 mg total) by mouth 2 (two) times daily with a meal. 180 tablet 3   sitaGLIPtin (JANUVIA) 100 MG tablet Take 1 tablet (100 mg total) by mouth daily. 90 tablet 1   No current facility-administered medications on file prior to visit.    BP (!) 156/95    Pulse 64    Resp 16    Ht 5'  11" (1.803 m)    Wt 274 lb (124.3 kg)    SpO2 99%    BMI 38.22 kg/m       Objective:   Physical Exam  General Mental Status- Alert. General Appearance- Not in acute distress.   Skin General: Color- Normal Color. Moisture- Normal Moisture.  Neck Carotid Arteries- Normal color. Moisture- Normal Moisture. No carotid bruits. No JVD.  Chest and Lung Exam Auscultation: Breath Sounds:-Normal.  Cardiovascular Auscultation:Rythm- Regular. Murmurs & Other Heart Sounds:Auscultation of the heart reveals- No Murmurs.  Abdomen Inspection:-Inspeection Normal. Palpation/Percussion:Note:No mass. Palpation and Percussion of the abdomen reveal- Non Tender, Non Distended + BS, no rebound or guarding.    Neurologic Cranial Nerve exam:- CN III-XII intact(No nystagmus), symmetric smile. Drift Test:- No drift. Romberg Exam:- Negative.  Heal to Toe Gait exam:-Normal. Finger to Nose:- Normal/Intact Strength:- 5/5 equal and symmetric strength both upper and lower extremities.       Assessment & Plan:  You do have recent left-sided lower back pain with some pain radiating towards the abdomen.  Pain present for 1 week.  Differential diagnosis could include shingles, kidney stone, muscle pain, spinal etiology or possible diverticulitis.  Presently recent history and associated signs symptoms difficult to give give exact diagnosis.  Will need to get CBC, CMP, lipase and urinalysis.  Included screening PSA.  We will need to follow lab results and then start the treatment.  Considering trial dose of Cipro and Flagyl since the pain is already present for 1 week.  To review labs first before prescription written.  Let us know if your signs symptoms change.  Particularly if you get any dermatomal rash then we need to give you shingles medication.  If any mid spinal pain would get lumbar spine x-ray.  For diabetes we will go ahead and check your A1c today.  Hypertension history and on recheck your blood  pressure was borderline controlled.  Running a little bit high.  Continue current medication.  For hyperlipidemia we will go ahead and check your lipid panel.  Do recommend continue ibuprofen pending lab review.  Follow-up in 7 to 10 days or as needed.  Time spent with patient today was 40  minutes which consisted of chart review, discussing diagnosis, work up, treatment and documentation.

## 2020-08-18 NOTE — Patient Instructions (Addendum)
You do have recent left-sided lower back pain with some pain radiating towards the abdomen.  Pain present for 1 week.  Differential diagnosis could include shingles, kidney stone, muscle pain, spinal etiology or possible diverticulitis.  Presently recent history and associated signs symptoms difficult to give give exact diagnosis.  Will need to get CBC, CMP, lipase and urinalysis.  Included screening PSA.  We will need to follow lab results and then start the treatment.  Considering trial dose of Cipro and Flagyl since the pain is already present for 1 week.  To review labs first before prescription written.  Let us know if your signs symptoms change.  Particularly if you get any dermatomal rash then we need to give you shingles medication.  If any mid spinal pain would get lumbar spine x-ray.  For diabetes we will go ahead and check your A1c today.  Hypertension history and on recheck your blood pressure was borderline controlled.  Running a little bit high.  Continue current medication.  For hyperlipidemia we will go ahead and check your lipid panel.  Note it of pain in the lower back and abdomen persist despite treatment might need to do CT imaging.  Do recommend continue ibuprofen pending lab review.  Follow-up in 7 to 10 days or as needed.

## 2020-08-19 ENCOUNTER — Telehealth: Payer: Self-pay | Admitting: Medical

## 2020-08-19 ENCOUNTER — Encounter: Payer: Self-pay | Admitting: Medical

## 2020-08-19 DIAGNOSIS — E119 Type 2 diabetes mellitus without complications: Secondary | ICD-10-CM

## 2020-08-19 MED ORDER — CIPROFLOXACIN HCL 500 MG PO TABS: 500.0000 mg | ORAL_TABLET | Freq: Two times a day (BID) | ORAL | 0 refills | Status: DC

## 2020-08-19 MED ORDER — METRONIDAZOLE 500 MG PO TABS: 500.0000 mg | ORAL_TABLET | Freq: Three times a day (TID) | ORAL | 0 refills | Status: DC

## 2020-08-19 NOTE — Telephone Encounter (Signed)
Referral to endocrine placed

## 2020-08-21 ENCOUNTER — Other Ambulatory Visit: Payer: Self-pay | Admitting: Medical

## 2020-08-21 NOTE — Telephone Encounter (Signed)
Medication on back order. °

## 2020-08-23 ENCOUNTER — Telehealth: Payer: Self-pay | Admitting: Medical

## 2020-08-23 MED ORDER — OFLOXACIN 400 MG PO TABS: 400.0000 mg | ORAL_TABLET | Freq: Two times a day (BID) | ORAL | 0 refills | Status: DC

## 2020-08-23 NOTE — Telephone Encounter (Signed)
Rx floxin sent to pt pharmacy. 

## 2020-08-23 NOTE — Telephone Encounter (Signed)
Rx floxin sent to pt pharmacy.

## 2020-08-28 ENCOUNTER — Other Ambulatory Visit: Payer: Self-pay | Admitting: Medical

## 2020-08-28 ENCOUNTER — Telehealth: Payer: Self-pay | Admitting: Medical

## 2020-08-28 MED ORDER — CIPROFLOXACIN HCL 500 MG PO TABS: 500.0000 mg | ORAL_TABLET | Freq: Two times a day (BID) | ORAL | 0 refills | Status: DC

## 2020-08-28 MED FILL — CIPROFLOXACIN HCL 500 MG TA: 500 | 7 days supply | Qty: 14 | Fill #0

## 2020-08-28 NOTE — Telephone Encounter (Signed)
rx cipro sent to [facility].

## 2020-09-10 ENCOUNTER — Telehealth (INDEPENDENT_AMBULATORY_CARE_PROVIDER_SITE_OTHER): Payer: Managed Care, Other (non HMO) | Admitting: Medical

## 2020-09-10 DIAGNOSIS — R059 Cough, unspecified: Secondary | ICD-10-CM | POA: Diagnosis not present

## 2020-09-10 MED ORDER — HYDROCODONE-HOMATROPINE 5-1.5 MG/5ML PO SYRP: 5.0000 mL | ORAL_SOLUTION | Freq: Four times a day (QID) | ORAL | 0 refills | Status: DC | PRN

## 2020-09-10 MED ORDER — FAMOTIDINE 20 MG PO TABS: 20.0000 mg | ORAL_TABLET | Freq: Every day | ORAL | 0 refills | Status: DC

## 2020-09-10 NOTE — Progress Notes (Addendum)
   Subjective:    Patient ID: Anthony Zamora, male    DOB: Jan 12, 1971, 50 y.o.   MRN: [id number]  HPI  Virtual Visit via Telephone Note  I connected with Anthony Zamora on 09/10/20 at  2:40 PM EST by telephone and verified that I am speaking with the correct person using two identifiers.  Location: Patient: home Provider: office [facility]- pt and myself.   I discussed the limitations, risks, security and privacy concerns of performing an evaluation and management service by telephone and the availability of in person appointments. I also discussed with the patient that there may be a patient responsible charge related to this service. The patient expressed understanding and agreed to proceed.   History of Present Illness: Pt states he has recent dry cough both day and night.   Pt states he [facility] cx[facility] done and no bronchitis. No heart burn or belching.  Though does note dry cough started after he took 2 antibiotics for probable diverticulitis.  Abdomen pain resolved.  Pt states when he takes mucinex will get some relief.  Cough started on January 7th, 2022.  After antibiotic is stated above.  Pt had 4 covid test since Aug 28, 2018.  No wheeze or sob.  No ACE inhibitor use and non-smoker    Observations/Objective: General-no acute distress, alert and oriented normal speech.   Assessment and Plan: Recent cough that started after finishing antibiotics for diverticulitis.  Chest x-ray was negative.  Note did review radiologist over read.  Also reviewed [facility]  notes.  No history of asthma, no smoking, negative COVID test, negative chest x-ray and negative upper respiratory type signs or symptoms.  Considering possible low level reflux causing cough and considering he might have postnasal drainage contributing factor.  Cough is severe enough that he is not able to sleep.  No shortness of breath reported.  No leg pain reported.  Will prescribe  famotidine to take 1 tablet a day and also prescribe Hycodan cough syrup.  Will see how cough responds over the next 7 days.    Then advised to hold both meds for 24 hours and update me in 8 days.  If signs symptoms worsen or change before 8 days can MyChart me or call office.  Esperanza Richters, PA-C   Time spent with patient today was 20   minutes which consisted of chart revdiew, discussing diagnosis, work up treatment and documentation.  Follow Up Instructions:    I discussed the assessment and treatment plan with the patient. The patient was provided an opportunity to ask questions and all were answered. The patient agreed with the plan and demonstrated an understanding of the instructions.   The patient was advised to call back or seek an in-person evaluation if the symptoms worsen or if the condition fails to improve as anticipated.  Time spent with patient today was 20  minutes which consisted of chart revdiew, discussing diagnosis, work up treatment and documentation.   Esperanza Richters, PA-C   Review of Systems     Objective:   Physical Exam        Assessment & Plan:

## 2020-09-10 NOTE — Patient Instructions (Signed)
Recent cough that started after finishing antibiotics for diverticulitis.  Chest x-ray was negative.  Note did review radiologist over read.  Also reviewed [facility]  notes.  No history of asthma, no smoking, negative COVID test, negative chest x-ray and negative upper respiratory type signs or symptoms.  Considering possible low level reflux causing cough and considering he might have postnasal drainage contributing factor.  Cough is severe enough that he is not able to sleep.  No shortness of breath reported.  No leg pain reported.  Will prescribe famotidine to take 1 tablet a day and also prescribe Hycodan cough syrup.  Will see how cough responds over the next 7 days.    Then advised to hold both meds for 24 hours and update me in 8 days.  If signs symptoms worsen or change before 8 days can MyChart me or call office.

## 2020-09-23 IMAGING — DX DG FOOT COMPLETE 3+V*R*
3 series · 3 of 3 positions shown · non-contrast
Comparison: None.

CLINICAL DATA: Heel pain for a few weeks with no injury

EXAM:
RIGHT FOOT COMPLETE - 3+ VIEW

[foot ap]
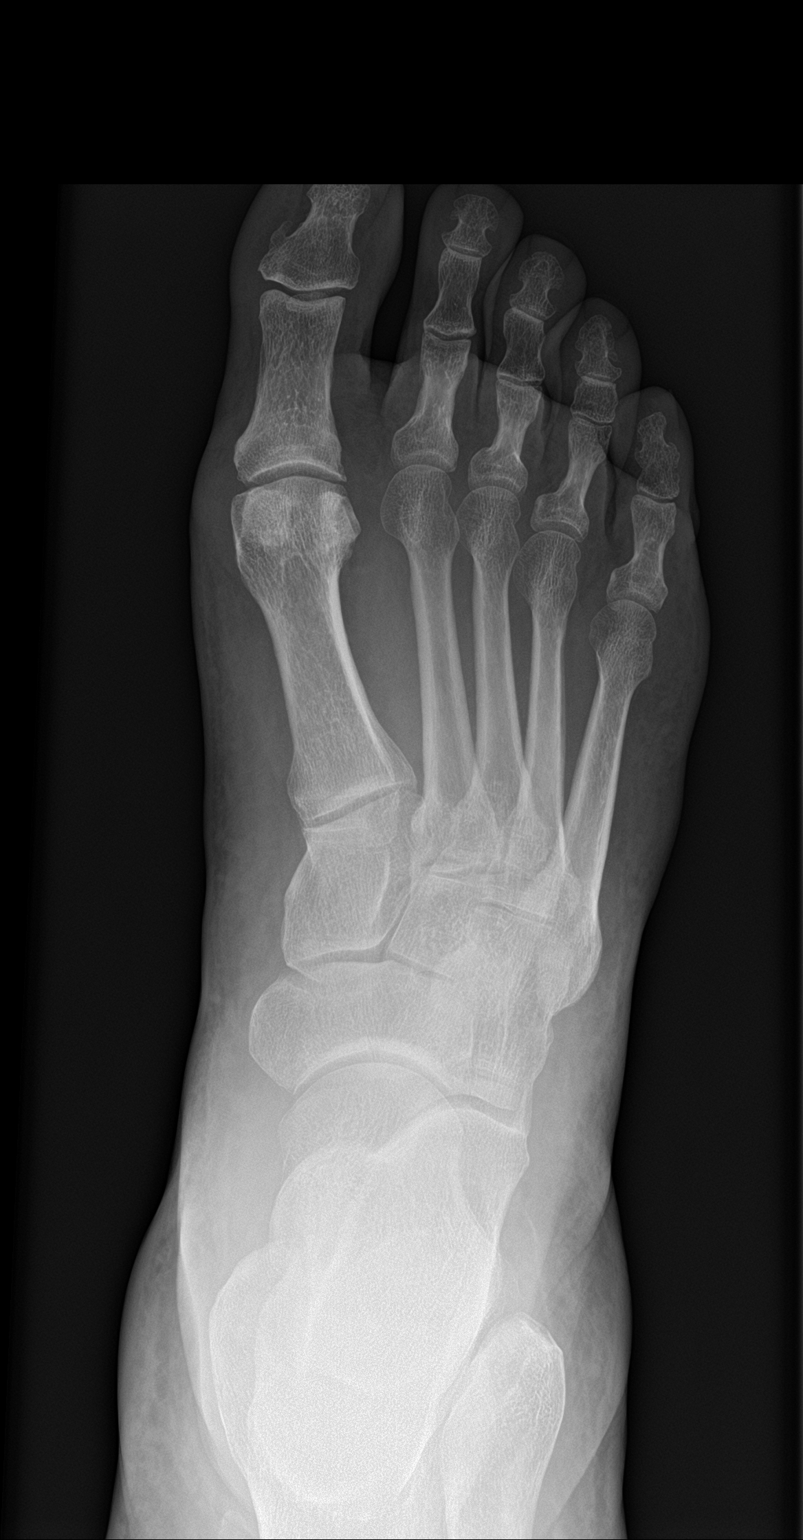

[foot obl]
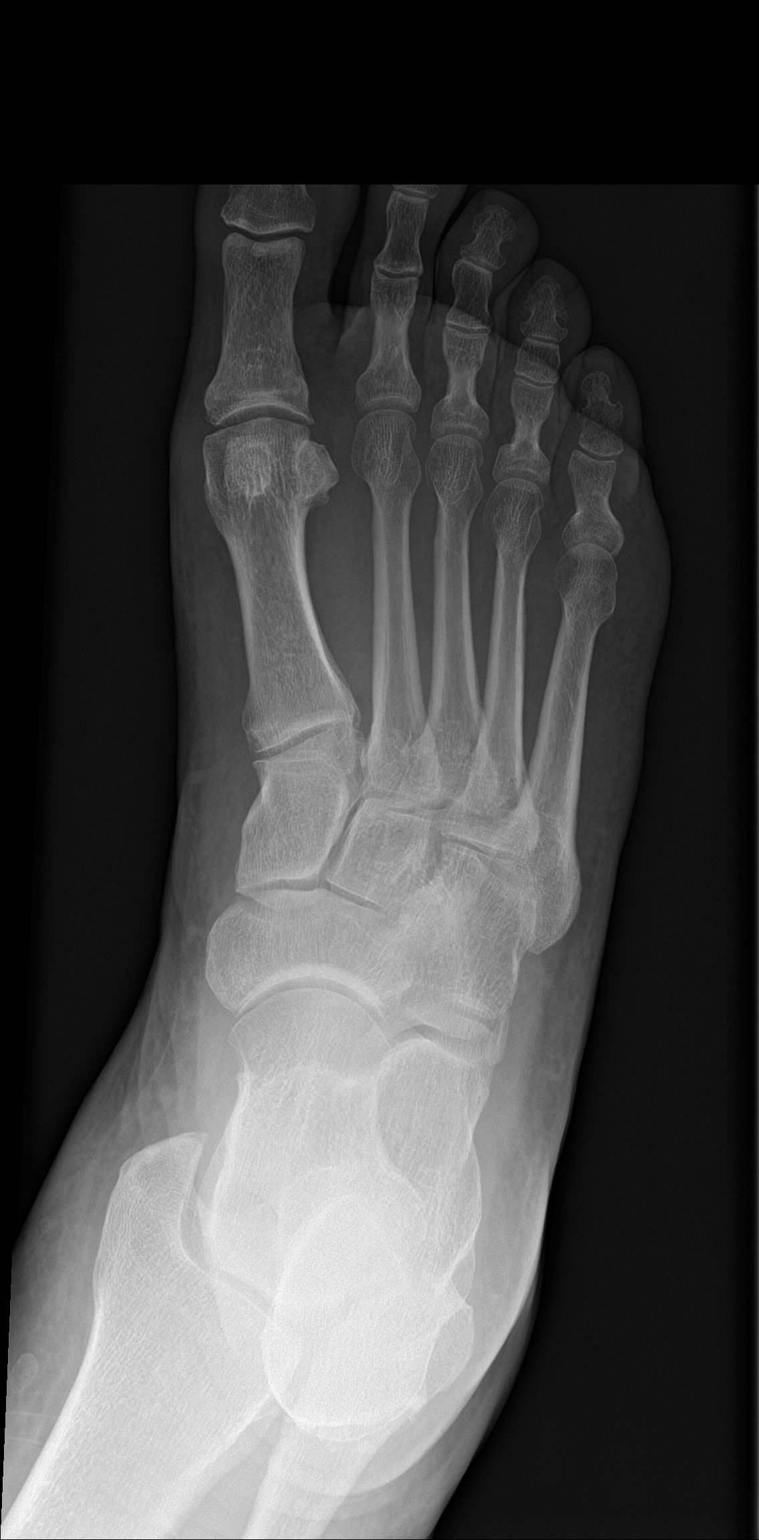

[foot lat]
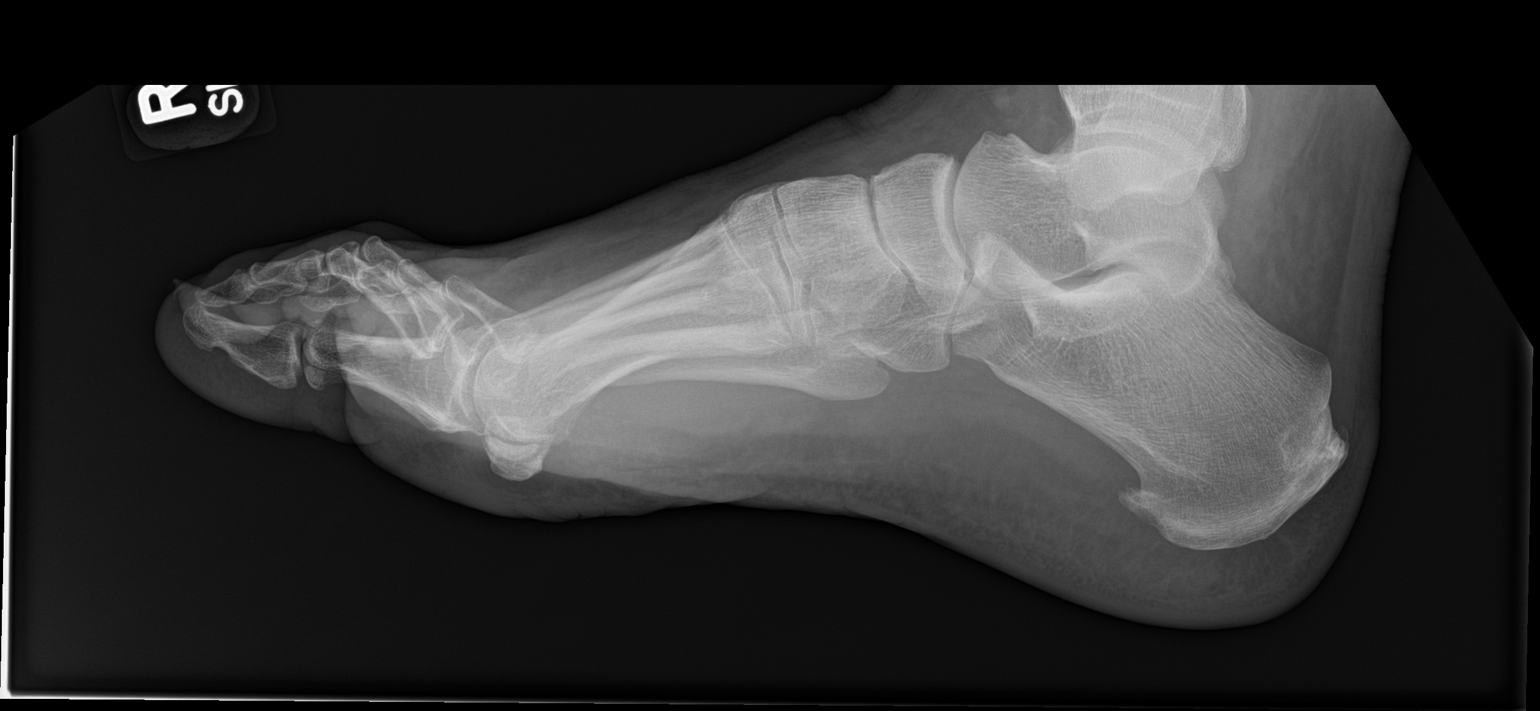

[3 of 3 positions shown; findings below may reference images not displayed]

FINDINGS: There is no evidence of fracture or stress reaction. Non eroded heel
spurs along the plantar and dorsal aspect. Negative soft tissues
IMPRESSION: No acute finding.

Heel spurs.

## 2020-10-03 ENCOUNTER — Other Ambulatory Visit: Payer: Self-pay | Admitting: Medical

## 2020-10-06 ENCOUNTER — Other Ambulatory Visit: Payer: Self-pay | Admitting: Medical

## 2020-11-19 ENCOUNTER — Ambulatory Visit (INDEPENDENT_AMBULATORY_CARE_PROVIDER_SITE_OTHER): Payer: Managed Care, Other (non HMO) | Admitting: Internal Medicine

## 2020-11-19 ENCOUNTER — Other Ambulatory Visit: Payer: Self-pay

## 2020-11-19 ENCOUNTER — Encounter: Payer: Self-pay | Admitting: Internal Medicine

## 2020-11-19 VITALS — BP 134/82 | HR 69 | Ht 71.0 in | Wt 266.1 lb

## 2020-11-19 DIAGNOSIS — E1165 Type 2 diabetes mellitus with hyperglycemia: Secondary | ICD-10-CM | POA: Insufficient documentation

## 2020-11-19 DIAGNOSIS — M722 Plantar fascial fibromatosis: Secondary | ICD-10-CM

## 2020-11-19 DIAGNOSIS — E785 Hyperlipidemia, unspecified: Secondary | ICD-10-CM

## 2020-11-19 LAB — POCT GLYCOSYLATED HEMOGLOBIN (HGB A1C): Hemoglobin A1C: 8.8 % — AB (ref 4.0–5.6)

## 2020-11-19 LAB — POCT GLUCOSE (DEVICE FOR HOME USE): POC Glucose: 191 mg/dl — AB (ref 70–99)

## 2020-11-19 MED ORDER — GLIPIZIDE 5 MG PO TABS: 5.0000 mg | ORAL_TABLET | Freq: Two times a day (BID) | ORAL | 1 refills | Status: DC

## 2020-11-19 MED ORDER — DAPAGLIFLOZIN PROPANEDIOL 5 MG PO TABS: 5.0000 mg | ORAL_TABLET | Freq: Every day | ORAL | 4 refills | Status: DC

## 2020-11-19 MED ORDER — ATORVASTATIN CALCIUM 10 MG PO TABS: 10.0000 mg | ORAL_TABLET | Freq: Every day | ORAL | 3 refills | Status: DC

## 2020-11-19 NOTE — Progress Notes (Signed)
Name: Anthony Zamora  MRN/ DOB: [id number], 03-21-1971   Age/ Sex: 50 y.o., male    PCP: Marisue Brooklyn   Reason for Endocrinology Evaluation: Type 2 Diabetes Mellitus     Date of Initial Endocrinology Visit: 11/19/2020     PATIENT IDENTIFIER: Anthony Zamora is a 50 y.o. male with a past medical history of T2DM, dyslipidemia and HTN. The patient presented for initial endocrinology clinic visit on 11/19/2020 for consultative assistance with his diabetes management.    HPI: Anthony Zamora was    Diagnosed with DM in 2019 Prior Medications tried/Intolerance: Metformin- explosive diarrhea , including extended release  Currently checking blood sugars occasionally  Hypoglycemia episodes : no              Hemoglobin A1c has ranged from 7.6% in 2020, peaking at 8.7% in 2019. Patient required assistance for hypoglycemia: no Patient has required hospitalization within the last 1 year from hyper or hypoglycemia: no  In terms of diet, the patient eats 3 meals a day, tries to avoid snacking   Has occasional soreness of the heel , worse with walking    Works in maintenance at [employer]    HOME DIABETES REGIMEN: Januvia 100 mg daily - on the list but not taking  Invokana 100 mg daily - on the list but not taking      Statin: Has Lipitor on his list but he is not taking  ACE-I/ARB: yes Prior Diabetic Education: no   METER DOWNLOAD SUMMARY: Did not bring      DIABETIC COMPLICATIONS: Microvascular complications:    Denies: CKD, retinopathy , neuropathy   Last eye exam: Completed 07/2020  Macrovascular complications:   Denies: CAD, PVD, CVA   PAST HISTORY: Past Medical History:  Past Medical History:  Diagnosis Date  . Diabetes mellitus without complication (HCC)   . Hyperlipidemia   . Hypertension    Past Surgical History:  Past Surgical History:  Procedure Laterality Date  . TONSILLECTOMY    . WISDOM TOOTH EXTRACTION        Social History:  reports  that he has never smoked. He has never used smokeless tobacco. He reports previous alcohol use. He reports that he does not use drugs. Family History:  Family History  Problem Relation Age of Onset  . Diabetes Father   . Kidney disease Father   . Colon cancer Neg Hx   . Colon polyps Neg Hx   . Esophageal cancer Neg Hx   . Rectal cancer Neg Hx   . Stomach cancer Neg Hx      HOME MEDICATIONS: Allergies as of 11/19/2020   No Known Allergies     Medication List       Accurate as of November 19, 2020  8:47 AM. If you have any questions, ask your nurse or doctor.        STOP taking these medications   ciprofloxacin 500 MG tablet Commonly known as: CIPRO Stopped by: Scarlette Shorts, MD   HYDROcodone-homatropine 5-1.5 MG/5ML syrup Commonly known as: HYCODAN Stopped by: Scarlette Shorts, MD   metroNIDAZOLE 500 MG tablet Commonly known as: FLAGYL Stopped by: Scarlette Shorts, MD     TAKE these medications   atorvastatin 10 MG tablet Commonly known as: LIPITOR Take 1 tablet (10 mg total) by mouth daily.   canagliflozin 100 MG Tabs tablet Commonly known as: Invokana Take 1 tablet (100 mg total) by mouth daily before breakfast.   famotidine 20 MG tablet Commonly  known as: PEPCID Take 1 tablet (20 mg total) by mouth daily.   fenofibrate 54 MG tablet TAKE 1 TABLET BY MOUTH DAILY   hydrochlorothiazide 12.5 MG capsule Commonly known as: MICROZIDE TAKE 1 CAPSULE BY MOUTH EVERY DAY   losartan 25 MG tablet Commonly known as: COZAAR Take 1 tablet (25 mg total) by mouth daily.   metFORMIN 1000 MG tablet Commonly known as: GLUCOPHAGE Take 1 tablet (1,000 mg total) by mouth 2 (two) times daily with a meal.   sitaGLIPtin 100 MG tablet Commonly known as: Januvia Take 1 tablet (100 mg total) by mouth daily.        ALLERGIES: No Known Allergies   REVIEW OF SYSTEMS: A comprehensive ROS was conducted with the patient and is negative except as per HPI and  below:  Review of Systems  Gastrointestinal: Positive for diarrhea.       Diarrhea while on Metformin   Genitourinary: Negative for frequency.  Neurological: Negative for tingling.  Endo/Heme/Allergies: Negative for polydipsia.      OBJECTIVE:   VITAL SIGNS: BP 134/82   Pulse 69   Ht 5\' 11"  (1.803 m)   Wt 266 lb 2 oz (120.7 kg)   SpO2 98%   BMI 37.12 kg/m    PHYSICAL EXAM:  General: Pt appears well and is in NAD  Neck: General: Supple without adenopathy or carotid bruits. Thyroid: Thyroid size normal.  No goiter or nodules appreciated.  Lungs: Clear with good BS bilat with no rales, rhonchi, or wheezes  Heart: RRR with normal S1 and S2 and no gallops; no murmurs; no rub  Abdomen: Normoactive bowel sounds, soft, nontender, without masses or organomegaly palpable  Extremities:  Lower extremities - No pretibial edema. No lesions.  Skin: Normal texture and temperature to palpation. No rash noted. No Acanthosis nigricans/skin tags. No lipohypertrophy.  Neuro: MS is good with appropriate affect, pt is alert and Ox3    DM foot exam: 11/19/2020  The skin of the feet is intact without sores or ulcerations. The pedal pulses are 2+ on right and 2+ on left. The sensation is intact to a screening 5.07, 10 gram monofilament bilaterally Pt has pin point tenderness over the right heel    DATA REVIEWED:  Lab Results  Component Value Date   HGBA1C 8.8 (A) 11/19/2020   HGBA1C 8.5 (H) 08/18/2020   HGBA1C 8.0 (H) 02/18/2020   Lab Results  Component Value Date   MICROALBUR <0.7 02/05/2019   LDLCALC 66 08/09/2019   CREATININE 0.75 08/18/2020   Lab Results  Component Value Date   MICRALBCREAT 0.9 02/05/2019    Lab Results  Component Value Date   CHOL 188 08/18/2020   HDL 41.10 08/18/2020   LDLCALC 66 08/09/2019   LDLDIRECT 76.0 08/18/2020   TRIG 360.0 (H) 08/18/2020   CHOLHDL 5 08/18/2020        ASSESSMENT / PLAN / RECOMMENDATIONS:   1) Type 2 Diabetes Mellitus, Poorly  controlled, Without complications - Most recent A1c of 8.8 %. Goal A1c <7.0 %.    Plan: GENERAL: I have discussed with the patient the pathophysiology of diabetes. We went over the natural progression of the disease. We talked about both insulin resistance and insulin deficiency. We stressed the importance of lifestyle changes including diet and exercise. I explained the complications associated with diabetes including retinopathy, nephropathy, neuropathy as well as increased risk of cardiovascular disease. We went over the benefit seen with glycemic control.    I explained to the patient  that diabetic patients are at higher than normal risk for amputations.   Encouraged walking, low carb diet and avoiding sugar -sweetened beverages   He has [place] and Invokana on his list but tells me he has not gotten those   He is Intolerant to Metformin including ER formulations  Discussed Glipizide and the importance otaking it before the first and last meal of the day, cautioned against hypoglycemia and weight gain   Will start Farxiga, cautioned against genital infections   MEDICATIONS: Start Glipizide 5 mg, 1 tablet before breakfast and 1 tablet before supper - Start Farxiga 5 3g, 1 tablet before Breakfast     EDUCATION / INSTRUCTIONS:  BG monitoring instructions: Patient is instructed to check his blood sugars 2 times a day, fasting and before supper.  Call [facility] if: BG persistently < 70  . I reviewed the Rule of 15 for the treatment of hypoglycemia in detail with the patient. Literature supplied.   2) Diabetic complications:   Eye: Does not have known diabetic retinopathy.   Neuro/ Feet: Does not have known diabetic peripheral neuropathy.  Renal: Patient does not have known baseline CKD. He is  on an ACEI/ARB at present.   3) Dyslipidemia:   - LDL at goal but Tg elevated at 322 m/dL - He has Atorvastatin and Fenofibrate listed but the pt is not taking  -  Discussed cardiovascular benefits of statins   Start  Atorvastatin 10 mg daily   4) Right Plantar Fasciitis:  - Discussed NSAID's as needed, stretching exercise and applying ice, we also discussed insoles        Signed electronically by: Lyndle Herrlich, MD  [facility]  [facility] [address] [place] [place], [place] [address] Phone: [phone] FAX: [phone]   CC: Marisue Brooklyn [address] [place] [address] Phone: [phone]  Fax: [phone]    Return to Endocrinology clinic as below: No future appointments.

## 2020-11-19 NOTE — Patient Instructions (Addendum)
-   Start Glipizide 5 mg, 1 tablet before breakfast and 1 tablet before supper - Start Farxiga 5 ,g, 1 tablet before Breakfast   - Start Atorvastatin 10 mg at bedtime ( cholesterol )    Choose healthy, lower carb lower calorie snacks: toss salad, vegetables, cottage cheese, peanut butter, low fat cheese / string cheese, lower sodium deli meat, tuna salad or chicken salad, nuts      HOW TO TREAT LOW BLOOD SUGARS (Blood sugar LESS THAN 70 MG/DL)  Please follow the RULE OF 15 for the treatment of hypoglycemia treatment (when your (blood sugars are less than 70 mg/dL)    STEP 1: Take 15 grams of carbohydrates when your blood sugar is low, which includes:   3-4 GLUCOSE TABS  OR  3-4 OZ OF JUICE OR REGULAR SODA OR  ONE TUBE OF GLUCOSE GEL     STEP 2: RECHECK blood sugar in 15 MINUTES STEP 3: If your blood sugar is still low at the 15 minute recheck --> then, go back to STEP 1 and treat AGAIN with another 15 grams of carbohydrates.

## 2021-02-26 ENCOUNTER — Other Ambulatory Visit: Payer: Self-pay

## 2021-02-26 ENCOUNTER — Ambulatory Visit (INDEPENDENT_AMBULATORY_CARE_PROVIDER_SITE_OTHER): Payer: Managed Care, Other (non HMO) | Admitting: Internal Medicine

## 2021-02-26 VITALS — BP 130/84 | HR 72 | Wt 268.0 lb

## 2021-02-26 DIAGNOSIS — E785 Hyperlipidemia, unspecified: Secondary | ICD-10-CM

## 2021-02-26 DIAGNOSIS — E1165 Type 2 diabetes mellitus with hyperglycemia: Secondary | ICD-10-CM

## 2021-02-26 LAB — POCT GLYCOSYLATED HEMOGLOBIN (HGB A1C): Hemoglobin A1C: 8.5 % — AB (ref 4.0–5.6)

## 2021-02-26 MED ORDER — GLIPIZIDE 5 MG PO TABS: 5.0000 mg | ORAL_TABLET | Freq: Two times a day (BID) | ORAL | 1 refills | Status: DC

## 2021-02-26 MED ORDER — DAPAGLIFLOZIN PROPANEDIOL 10 MG PO TABS: 10.0000 mg | ORAL_TABLET | Freq: Every day | ORAL | 1 refills | Status: DC

## 2021-02-26 NOTE — Progress Notes (Signed)
Name: Anthony Zamora  Age/ Sex: 50 y.o., male   MRN/ DOB: [id number], 1971/05/12     PCP: Marisue Brooklyn   Reason for Endocrinology Evaluation: Type 2 Diabetes Mellitus  Initial Endocrine Consultative Visit: 11/19/2020    PATIENT IDENTIFIER: Anthony Zamora is a 50 y.o. male with a past medical history of T2DM, HTN and Dyslipidemia . The patient has followed with Endocrinology clinic since 11/19/2020 for consultative assistance with management of his diabetes.  DIABETIC HISTORY:  Anthony Zamora was diagnosed with DM in 2019, Metformin- explosive diarrhea. His hemoglobin A1c has ranged from 7.6% in 2020, peaking at 8.7% in 2019    On his initial visit to our clinic his A1c was 8.8% , he was not taking any medications despite listed medication of Januvia and Invokana. 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 He checks his blood sugars 2-3 times daily. The patient has not had hypoglycemic episodes since the last clinic visit  Denies nausea or diarrhea      HOME DIABETES REGIMEN:  Glipizide 5 mg, 1 tablet before breakfast and 1 tablet before supper Farxiga 5 mg, 1 tablet before Breakfast  Atorvastatin 10 mg daily      Statin: yes ACE-I/ARB: yes Prior Diabetic Education: no   METER DOWNLOAD SUMMARY: Did not bring       DIABETIC COMPLICATIONS: Microvascular complications:   Denies: CKD, retinopathy, neuropathy Last Eye Exam: Completed   Macrovascular complications:   Denies: CAD, CVA, PVD   HISTORY:  Past Medical History:  Past Medical History:  Diagnosis Date   Diabetes mellitus without complication (HCC)    Hyperlipidemia    Hypertension    Past Surgical History:  Past Surgical History:  Procedure Laterality Date   TONSILLECTOMY     WISDOM TOOTH  EXTRACTION     Social History:  reports that he has never smoked. He has never used smokeless tobacco. He reports previous alcohol use. He reports that he does not use drugs. Family History:  Family History  Problem Relation Age of Onset   Diabetes Father    Kidney disease Father    Colon cancer Neg Hx    Colon polyps Neg Hx    Esophageal cancer Neg Hx    Rectal cancer Neg Hx    Stomach cancer Neg Hx      HOME MEDICATIONS: Allergies as of 02/26/2021   No Known Allergies      Medication List        Accurate as of February 26, 2021  1:54 PM. If you have any questions, ask your nurse or doctor.          atorvastatin 10 MG tablet Commonly known as: LIPITOR Take 1 tablet (10 mg total) by mouth daily.   dapagliflozin propanediol 10 MG Tabs tablet Commonly known as: Farxiga Take 1 tablet (10 mg total) by mouth daily before breakfast. What changed:  medication strength how much to take when to take this Changed by: Scarlette Shorts, MD   glipiZIDE 5 MG tablet Commonly known as: GLUCOTROL Take 1 tablet (5 mg total) by mouth 2 (two) times daily before a meal.   hydrochlorothiazide 12.5 MG capsule Commonly known as: MICROZIDE TAKE 1 CAPSULE BY MOUTH EVERY DAY   losartan 25 MG tablet Commonly known as: COZAAR Take  1 tablet (25 mg total) by mouth daily.         OBJECTIVE:   Vital Signs: BP 130/84   Pulse 72   Wt 268 lb (121.6 kg)   SpO2 98%   BMI 37.38 kg/m   Wt Readings from Last 3 Encounters:  02/26/21 268 lb (121.6 kg)  11/19/20 266 lb 2 oz (120.7 kg)  08/18/20 274 lb (124.3 kg)     Exam: General: Pt appears well and is in NAD  Lungs: Clear with good BS bilat with no rales, rhonchi, or wheezes  Heart: RRR with normal S1 and S2 and no gallops; no murmurs; no rub  Abdomen: Normoactive bowel sounds, soft, nontender, without masses or organomegaly palpable  Extremities: No pretibial edema.   Neuro: MS is good with appropriate affect, pt is alert and  Ox3   DM foot exam: 11/19/2020   The skin of the feet is intact without sores or ulcerations. The pedal pulses are 2+ on right and 2+ on left. The sensation is intact to a screening 5.07, 10 gram monofilament bilaterally          DATA REVIEWED:  Lab Results  Component Value Date   HGBA1C 8.5 (A) 02/26/2021   HGBA1C 8.8 (A) 11/19/2020   HGBA1C 8.5 (H) 08/18/2020   Lab Results  Component Value Date   MICROALBUR <0.7 02/05/2019   LDLCALC 66 08/09/2019   CREATININE 0.75 08/18/2020   Lab Results  Component Value Date   MICRALBCREAT 0.9 02/05/2019     Lab Results  Component Value Date   CHOL 188 08/18/2020   HDL 41.10 08/18/2020   LDLCALC 66 08/09/2019   LDLDIRECT 76.0 08/18/2020   TRIG 360.0 (H) 08/18/2020   CHOLHDL 5 08/18/2020         ASSESSMENT / PLAN / RECOMMENDATIONS:   1) Type 2 Diabetes Mellitus, Poorly controlled, Without complications - Most recent A1c of 8.5%. Goal A1c < 7.0 %.    -His A1c is slightly better but not as good as I expected it to be especially with starting glipizide and Farxiga. - He is Intolerant to Metformin including ER formulations -No glucose meter today -I am going to increase [place] as below, I have encouraged compliance as well as low-carb diet  MEDICATIONS: Glipizide 5 mg, 1 tablet before breakfast and 1 tablet before supper Increase  Farxiga to 10 mg, 1 tablet before Breakfast    EDUCATION / INSTRUCTIONS: BG monitoring instructions: Patient is instructed to check his blood sugars 2-3 times a day, week. Call [facility] if: BG persistently < 70  I reviewed the Rule of 15 for the treatment of hypoglycemia in detail with the patient. Literature supplied.    2) Diabetic complications:  Eye: Does not have known diabetic retinopathy.  Neuro/ Feet: Does not have known diabetic peripheral neuropathy .  Renal: Patient does not have known baseline CKD. He   is on an ACEI/ARB at present.    3) Dyslipidemia:     - He assures me compliance with Atorvastatin, no side effects, will continue    Continue  Atorvastatin 10 mg daily   F/U in 4 months     Signed electronically by: Lyndle Herrlich, MD  [facility]  [facility] [address] [place] [place], [place] [address] Phone: [phone] FAX: [phone]   CC: Marisue Brooklyn [address] [place] [address] Phone: [phone]  Fax: [phone]  Return to Endocrinology clinic as  below: Future Appointments  Date Time Provider Department Center  07/02/2021  7:30 AM Anthony Zamora, Konrad Dolores, MD [id number] None

## 2021-02-26 NOTE — Patient Instructions (Signed)
-   Continue Glipizide 5 mg, 1 tablet before breakfast and 1 tablet before supper - Increase Farxiga to 10 mg, 1 tablet before Breakfast   - Continue  Atorvastatin 10 mg at bedtime ( cholesterol )     HOW TO TREAT LOW BLOOD SUGARS (Blood sugar LESS THAN 70 MG/DL) Please follow the RULE OF 15 for the treatment of hypoglycemia treatment (when your (blood sugars are less than 70 mg/dL)   STEP 1: Take 15 grams of carbohydrates when your blood sugar is low, which includes:  3-4 GLUCOSE TABS  OR 3-4 OZ OF JUICE OR REGULAR SODA OR ONE TUBE OF GLUCOSE GEL    STEP 2: RECHECK blood sugar in 15 MINUTES STEP 3: If your blood sugar is still low at the 15 minute recheck --> then, go back to STEP 1 and treat AGAIN with another 15 grams of carbohydrates.

## 2021-02-27 ENCOUNTER — Encounter: Payer: Self-pay | Admitting: Internal Medicine

## 2021-04-05 ENCOUNTER — Other Ambulatory Visit: Payer: Self-pay | Admitting: Medical

## 2021-07-02 ENCOUNTER — Ambulatory Visit: Payer: Managed Care, Other (non HMO) | Admitting: Internal Medicine

## 2021-07-29 ENCOUNTER — Encounter: Payer: Self-pay | Admitting: Internal Medicine

## 2021-07-29 ENCOUNTER — Other Ambulatory Visit: Payer: Self-pay

## 2021-07-29 ENCOUNTER — Ambulatory Visit (INDEPENDENT_AMBULATORY_CARE_PROVIDER_SITE_OTHER): Payer: Managed Care, Other (non HMO) | Admitting: Internal Medicine

## 2021-07-29 DIAGNOSIS — E1165 Type 2 diabetes mellitus with hyperglycemia: Secondary | ICD-10-CM

## 2021-07-29 DIAGNOSIS — E785 Hyperlipidemia, unspecified: Secondary | ICD-10-CM

## 2021-07-29 LAB — POCT GLYCOSYLATED HEMOGLOBIN (HGB A1C): Hemoglobin A1C: 8.9 % — AB (ref 4.0–5.6)

## 2021-07-29 LAB — MICROALBUMIN / CREATININE URINE RATIO
Creatinine,U: 275.2 mg/dL
Microalb Creat Ratio: 1 mg/g (ref 0.0–30.0)
Microalb, Ur: 2.7 mg/dL — ABNORMAL HIGH (ref 0.0–1.9)

## 2021-07-29 LAB — BASIC METABOLIC PANEL
BUN: 16 mg/dL (ref 6–23)
CO2: 26 mEq/L (ref 19–32)
Calcium: 9.3 mg/dL (ref 8.4–10.5)
Chloride: 103 mEq/L (ref 96–112)
Creatinine, Ser: 0.78 mg/dL (ref 0.40–1.50)
GFR: 102.81 mL/min (ref >60.00)
Glucose, Bld: 213 mg/dL — ABNORMAL HIGH (ref 70–99)
Potassium: 3.9 mEq/L (ref 3.5–5.1)
Sodium: 136 mEq/L (ref 135–145)

## 2021-07-29 LAB — LIPID PANEL
Cholesterol: 192 mg/dL (ref 0–200)
HDL: 38.5 mg/dL — ABNORMAL LOW (ref >39.00)
Total CHOL/HDL Ratio: 5
Triglycerides: 466 mg/dL — ABNORMAL HIGH (ref 0.0–149.0)

## 2021-07-29 LAB — GLUCOSE, POCT (MANUAL RESULT ENTRY): POC Glucose: 248 mg/dl — AB (ref 70–99)

## 2021-07-29 LAB — LDL CHOLESTEROL, DIRECT: Direct LDL: 85 mg/dL

## 2021-07-29 MED ORDER — GLIPIZIDE 10 MG PO TABS: 10.0000 mg | ORAL_TABLET | Freq: Two times a day (BID) | ORAL | 2 refills | Status: DC

## 2021-07-29 MED ORDER — DAPAGLIFLOZIN PROPANEDIOL 10 MG PO TABS: 10.0000 mg | ORAL_TABLET | Freq: Every day | ORAL | 3 refills | Status: DC

## 2021-07-29 MED ORDER — RYBELSUS 3 MG PO TABS: 3.0000 mg | ORAL_TABLET | Freq: Every day | ORAL | 5 refills | Status: DC

## 2021-07-29 MED ORDER — FENOFIBRATE 145 MG PO TABS: 145.0000 mg | ORAL_TABLET | Freq: Every day | ORAL | 3 refills | Status: DC

## 2021-07-29 NOTE — Progress Notes (Signed)
Name: Anthony Zamora  Age/ Sex: 50 y.o., male   MRN/ DOB: [id number], April 02, 1971     PCP: Marisue Brooklyn   Reason for Endocrinology Evaluation: Type 2 Diabetes Mellitus  Initial Endocrine Consultative Visit: 11/19/2020    PATIENT IDENTIFIER: Anthony Zamora is a 50 y.o. male with a past medical history of T2DM, HTN and Dyslipidemia . The patient has followed with Endocrinology clinic since 11/19/2020 for consultative assistance with management of his diabetes.  DIABETIC HISTORY:  Anthony Zamora was diagnosed with DM in 2019, Metformin- explosive diarrhea. His hemoglobin A1c has ranged from 7.6% in 2020, peaking at 8.7% in 2019    On his initial visit to our clinic his A1c was 8.8% , he was not taking any medications despite listed medication of Januvia and Invokana. 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 He checks his blood sugars occonally . The patient has not had hypoglycemic episodes since the last clinic visit   He is not sure of his meds because his wife does his pill box  This Am he drank half and half tea at 2 am because he was thirsty   Denies nausea , vomiting or diarrhea      HOME DIABETES REGIMEN:  Glipizide 5 mg, 1 tablet before breakfast and 1 tablet before supper Farxiga 10 mg, 1 tablet before Breakfast  Atorvastatin 10 mg daily      Statin: yes ACE-I/ARB: yes Prior Diabetic Education: no   METER DOWNLOAD SUMMARY: Did not bring       DIABETIC COMPLICATIONS: Microvascular complications:   Denies: CKD, retinopathy, neuropathy Last Eye Exam: Completed 2022  Macrovascular complications:   Denies: CAD, CVA, PVD   HISTORY:  Past Medical History:  Past Medical History:  Diagnosis Date   Diabetes mellitus without  complication (HCC)    Hyperlipidemia    Hypertension    Past Surgical History:  Past Surgical History:  Procedure Laterality Date   TONSILLECTOMY     WISDOM TOOTH EXTRACTION     Social History:  reports that he has never smoked. He has never used smokeless tobacco. He reports that he does not currently use alcohol. He reports that he does not use drugs. Family History:  Family History  Problem Relation Age of Onset   Diabetes Father    Kidney disease Father    Colon cancer Neg Hx    Colon polyps Neg Hx    Esophageal cancer Neg Hx    Rectal cancer Neg Hx    Stomach cancer Neg Hx      HOME MEDICATIONS: Allergies as of 07/29/2021   No Known Allergies      Medication List        Accurate as of July 29, 2021  2:56 PM. If you have any questions, ask your nurse or doctor.          atorvastatin 10 MG tablet Commonly known as: LIPITOR Take 1 tablet (10 mg total) by mouth daily.   dapagliflozin propanediol 10 MG Tabs tablet Commonly known as: Farxiga Take 1 tablet (10 mg total) by mouth daily before breakfast.   glipiZIDE 10 MG tablet Commonly known as: GLUCOTROL Take 1 tablet (10 mg total) by mouth 2 (two) times daily before a meal. What changed:  medication  strength how much to take Changed by: Scarlette Shorts, MD   hydrochlorothiazide 12.5 MG capsule Commonly known as: MICROZIDE TAKE 1 CAPSULE BY MOUTH EVERY DAY   losartan 25 MG tablet Commonly known as: COZAAR TAKE 1 TABLET (25 MG TOTAL) BY MOUTH DAILY.   Rybelsus 3 MG Tabs Generic drug: Semaglutide Take 3 mg by mouth daily. Started by: Scarlette Shorts, MD         OBJECTIVE:   Vital Signs: There were no vitals taken for this visit.  Wt Readings from Last 3 Encounters:  02/26/21 268 lb (121.6 kg)  11/19/20 266 lb 2 oz (120.7 kg)  08/18/20 274 lb (124.3 kg)     Exam: General: Pt appears well and is in NAD  Lungs: Clear with good BS bilat with no rales, rhonchi, or wheezes   Heart: RRR with normal S1 and S2 and no gallops; no murmurs; no rub  Abdomen: Normoactive bowel sounds, soft, nontender, without masses or organomegaly palpable  Extremities: No pretibial edema.   Neuro: MS is good with appropriate affect, pt is alert and Ox3   DM foot exam: 07/29/2021   The skin of the feet is intact without sores or ulcerations. The pedal pulses are 2+ on right and 2+ on left. The sensation is intact to a screening 5.07, 10 gram monofilament bilaterally        DATA REVIEWED:  Lab Results  Component Value Date   HGBA1C 8.9 (A) 07/29/2021   HGBA1C 8.5 (A) 02/26/2021   HGBA1C 8.8 (A) 11/19/2020     Latest Reference Range & Units 07/29/21 08:06  Sodium 135 - 145 mEq/L 136  Potassium 3.5 - 5.1 mEq/L 3.9  Chloride 96 - 112 mEq/L 103  CO2 19 - 32 mEq/L 26  Glucose 70 - 99 mg/dL 818 (H)  BUN 6 - 23 mg/dL 16  Creatinine 2.99 - 3.71 mg/dL 6.96  Calcium 8.4 - 78.9 mg/dL 9.3  GFR >38.10 mL/min 102.81  Total CHOL/HDL Ratio  5  Cholesterol 0 - 200 mg/dL 175  HDL Cholesterol >10.25 mg/dL 85.27 (L)  Direct LDL mg/dL 78.2  MICROALB/CREAT RATIO 0.0 - 30.0 mg/g 1.0  Triglycerides 0.0 - 149.0 mg/dL 423.5 Triglyceride is over 400; calculations on Lipids are invalid. (H)    Latest Reference Range & Units 07/29/21 08:06  Creatinine,U mg/dL 361.4  Microalb, Ur 0.0 - 1.9 mg/dL 2.7 (H)  MICROALB/CREAT RATIO 0.0 - 30.0 mg/g 1.0      ASSESSMENT / PLAN / RECOMMENDATIONS:   1) Type 2 Diabetes Mellitus, Poorly controlled, Without complications - Most recent A1c of 8.9%. Goal A1c < 7.0 %.    -His A1c has been trending up despite being on Glipizide and increasing [place]. He was unable to verify his medications today because he is depending on his spouse to pick up meds and pill his pill bottle, he was unable to tell me how much Glipizide he takes a day etc.  - I contacted his pharmacy and pick up history verified as below   Glipizide # 180 on 11/19/2020 and 02/23/2021 Farxiga  #30 on 02/17/2021 and #90 on 02/26/2021  - We had a long discussion about taking ownership of his medications  and as if noe he has been out of glycemic agents for 2 months.  - He is Intolerant to Metformin including ER formulations.  -No glucose meter today -I am going to increase Glipizide and start a small dose of Rybelsus. Cautioned against GI side effects. No hx of  pancreatitis  - Coupon provided for farxiga and Rybelsus   MEDICATIONS: Increase Glipizide 10 mg, 1 tablet before breakfast and 1 tablet before supper Continue  Farxiga to 10 mg, 1 tablet before Breakfast  Start Rybelsus 3 mg daily    EDUCATION / INSTRUCTIONS: BG monitoring instructions: Patient is instructed to check his blood sugars 2-3 times a day, week. Call [facility] if: BG persistently < 70  I reviewed the Rule of 15 for the treatment of hypoglycemia in detail with the patient. Literature supplied.    2) Diabetic complications:  Eye: Does not have known diabetic retinopathy.  Neuro/ Feet: Does not have known diabetic peripheral neuropathy .  Renal: Patient does not have known baseline CKD. He   is on an ACEI/ARB at present.    3) Dyslipidemia:    - Lipid panel today elevated TG, this increases the risk of pancreatitis we will emphasize the importance of low-fat diet, continue with atorvastatin and will start fenofibrate   Continue  Atorvastatin 10 mg daily Start fenofibrate 145 mg daily  F/U in 3 months     Signed electronically by: Lyndle Herrlich, MD  [facility]  [facility] [address] [place] [place], [place] [address] Phone: [phone] FAX: [phone]   CC: Marisue Brooklyn [id number] [facility] [address] [place] [address] Phone: [phone]  Fax: [phone]  Return to Endocrinology clinic as below: Future Appointments  Date Time Provider Department Center  10/29/2021  7:50 AM Riddik Senna, Konrad Dolores, MD [id number]  None

## 2021-07-29 NOTE — Patient Instructions (Addendum)
-   Increase  Glipizide to 10  mg, 1 tablet before breakfast and 1 tablet before supper - Continue  Farxiga 10 mg, 1 tablet before Breakfast  - Start Rybelsus 3 mg , 1 tablet before Breakfast ( preferably 30 minutes before Breakfast , you may take it with Morning Glipizide)   - Continue  Atorvastatin 10 mg at bedtime ( cholesterol )     HOW TO TREAT LOW BLOOD SUGARS (Blood sugar LESS THAN 70 MG/DL) Please follow the RULE OF 15 for the treatment of hypoglycemia treatment (when your (blood sugars are less than 70 mg/dL)   STEP 1: Take 15 grams of carbohydrates when your blood sugar is low, which includes:  3-4 GLUCOSE TABS  OR 3-4 OZ OF JUICE OR REGULAR SODA OR ONE TUBE OF GLUCOSE GEL    STEP 2: RECHECK blood sugar in 15 MINUTES STEP 3: If your blood sugar is still low at the 15 minute recheck --> then, go back to STEP 1 and treat AGAIN with another 15 grams of carbohydrates.

## 2021-10-19 ENCOUNTER — Other Ambulatory Visit: Payer: Self-pay | Admitting: Medical

## 2021-10-29 ENCOUNTER — Ambulatory Visit (INDEPENDENT_AMBULATORY_CARE_PROVIDER_SITE_OTHER): Payer: Managed Care, Other (non HMO) | Admitting: Internal Medicine

## 2021-10-29 ENCOUNTER — Other Ambulatory Visit: Payer: Self-pay

## 2021-10-29 ENCOUNTER — Encounter: Payer: Self-pay | Admitting: Internal Medicine

## 2021-10-29 VITALS — BP 130/80 | HR 80 | Ht 71.0 in | Wt 269.0 lb

## 2021-10-29 DIAGNOSIS — E785 Hyperlipidemia, unspecified: Secondary | ICD-10-CM | POA: Diagnosis not present

## 2021-10-29 DIAGNOSIS — E119 Type 2 diabetes mellitus without complications: Secondary | ICD-10-CM

## 2021-10-29 LAB — POCT GLUCOSE (DEVICE FOR HOME USE): Glucose Fasting, POC: 109 mg/dL — AB (ref 70–99)

## 2021-10-29 LAB — POCT GLYCOSYLATED HEMOGLOBIN (HGB A1C): Hemoglobin A1C: 6.4 % — AB (ref 4.0–5.6)

## 2021-10-29 MED ORDER — DAPAGLIFLOZIN PROPANEDIOL 10 MG PO TABS: 10.0000 mg | ORAL_TABLET | Freq: Every day | ORAL | 3 refills | Status: DC

## 2021-10-29 MED ORDER — GLIPIZIDE 10 MG PO TABS: ORAL_TABLET | ORAL | 3 refills | Status: DC

## 2021-10-29 NOTE — Progress Notes (Signed)
?Name: Anthony Zamora  ?Age/ Sex: 51 y.o., male   ?MRN/ DOB: [id number], Apr 28, 1971    ? ?PCP: Esperanza Richters, PA-C   ?Reason for Endocrinology Evaluation: Type 2 Diabetes Mellitus  ?Initial Endocrine Consultative Visit: 11/19/2020  ? ? ?PATIENT IDENTIFIER: Anthony Zamora is a 51 y.o. male with a past medical history of T2DM, HTN and Dyslipidemia . The patient has followed with Endocrinology clinic since 11/19/2020 for consultative assistance with management of his diabetes. ? ?DIABETIC HISTORY:  ?Anthony Zamora was diagnosed with DM in 2019, Metformin- explosive diarrhea. His hemoglobin A1c has ranged from 7.6% in 2020, peaking at 8.7% in 2019 ? ? ? ?On his initial visit to our clinic his A1c was 8.8% , he was not taking any medications despite listed medication of Januvia and Invokana. We started Glipizide and [place]  ? ? ?We started Rybelsus 07/2021 but also the pharmacy confirmed intermittent pick up of his glycemic agents on 07/29/2021 ? ?Works in Production designer, theatre/television/film at [employer]  ?SUBJECTIVE:  ? ?During the last visit (07/29/2021): A1c 8.9% Increased lipizide and continued farxiga and started Rybelsus  ? ? ? ? ? ?Today (10/29/2021): Anthony Zamora is here for a follow up on diabetes management. He checks his blood sugars occasionally  . The patient has  had hypoglycemic episodes since the last clinic visit but has noted tight BG's in the middle of the day  ? ?Pt has been noted with weight loss  ?Denies nausea , vomiting or diarrhea  ? ?He has not been on Rybelsus  ? ? ?HOME DIABETES REGIMEN:  ?Glipizide 10 mg, 1 tablet before breakfast and 1 tablet before supper ?Farxiga 10 mg, 1 tablet before Breakfast  ?Rybelsus 3 mg daily  ?Atorvastatin 10 mg daily  ?Continue fenofibrate 145 mg daily ? ? ? ?Statin: yes ?ACE-I/ARB: yes ?Prior Diabetic Education: no ? ? ?METER DOWNLOAD SUMMARY: Did not bring  ? ? ? ? ? ?DIABETIC COMPLICATIONS: ?Microvascular complications:  ? ?Denies: CKD, retinopathy, neuropathy ?Last Eye Exam: Completed  2022 ? ?Macrovascular complications:  ? ?Denies: CAD, CVA, PVD ? ? ?HISTORY:  ?Past Medical History:  ?Past Medical History:  ?Diagnosis Date  ? Diabetes mellitus without complication (HCC)   ? Hyperlipidemia   ? Hypertension   ? ?Past Surgical History:  ?Past Surgical History:  ?Procedure Laterality Date  ? TONSILLECTOMY    ? WISDOM TOOTH EXTRACTION    ? ?Social History:  reports that he has never smoked. He has never used smokeless tobacco. He reports that he does not currently use alcohol. He reports that he does not use drugs. ?Family History:  ?Family History  ?Problem Relation Age of Onset  ? Diabetes Father   ? Kidney disease Father   ? Colon cancer Neg Hx   ? Colon polyps Neg Hx   ? Esophageal cancer Neg Hx   ? Rectal cancer Neg Hx   ? Stomach cancer Neg Hx   ? ? ? ?HOME MEDICATIONS: ?Allergies as of 10/29/2021   ?No Known Allergies ?  ? ?  ?Medication List  ?  ? ?  ? Accurate as of October 29, 2021  7:51 AM. If you have any questions, ask your nurse or doctor.  ?  ?  ? ?  ? ?atorvastatin 10 MG tablet ?Commonly known as: LIPITOR ?Take 1 tablet (10 mg total) by mouth daily. ?  ?dapagliflozin propanediol 10 MG Tabs tablet ?Commonly known as: [place] ?Take 1 tablet (10 mg total) by mouth daily before breakfast. ?  ?fenofibrate  145 MG tablet ?Commonly known as: Tricor ?Take 1 tablet (145 mg total) by mouth daily. ?  ?glipiZIDE 10 MG tablet ?Commonly known as: GLUCOTROL ?Take 1 tablet (10 mg total) by mouth 2 (two) times daily before a meal. ?  ?hydrochlorothiazide 12.5 MG capsule ?Commonly known as: MICROZIDE ?TAKE 1 CAPSULE BY MOUTH EVERY DAY ?  ?losartan 25 MG tablet ?Commonly known as: COZAAR ?TAKE 1 TABLET (25 MG TOTAL) BY MOUTH DAILY. ?  ?Rybelsus 3 MG Tabs ?Generic drug: Semaglutide ?Take 3 mg by mouth daily. ?  ? ?  ? ? ? ?OBJECTIVE:  ? ?Vital Signs: BP 130/80 (BP Location: Left Arm, Patient Position: Sitting, Cuff Size: Large)   Pulse 80   Ht 5\' 11"  (1.803 m)   Wt 269 lb (122 kg)   SpO2 96%   BMI 37.52  kg/m?   ?Wt Readings from Last 3 Encounters:  ?10/29/21 269 lb (122 kg)  ?02/26/21 268 lb (121.6 kg)  ?11/19/20 266 lb 2 oz (120.7 kg)  ? ? ? ?Exam: ?General: Pt appears well and is in NAD  ?Lungs: Clear with good BS bilat with no rales, rhonchi, or wheezes  ?Heart: RRR with normal S1 and S2 and no gallops; no murmurs; no rub  ?Abdomen: Normoactive bowel sounds, soft, nontender, without masses or organomegaly palpable  ?Extremities: No pretibial edema.   ?Neuro: MS is good with appropriate affect, pt is alert and Ox3  ? ?DM foot exam: 07/29/2021 ?  ?The skin of the feet is intact without sores or ulcerations. ?The pedal pulses are 2+ on right and 2+ on left. ?The sensation is intact to a screening 5.07, 10 gram monofilament bilaterally ? ?  ?   ? ?DATA REVIEWED: ? ?Lab Results  ?Component Value Date  ? HGBA1C 6.4 (A) 10/29/2021  ? HGBA1C 8.9 (A) 07/29/2021  ? HGBA1C 8.5 (A) 02/26/2021  ? ? ? Latest Reference Range & Units 07/29/21 08:06  ?Sodium 135 - 145 mEq/L 136  ?Potassium 3.5 - 5.1 mEq/L 3.9  ?Chloride 96 - 112 mEq/L 103  ?CO2 19 - 32 mEq/L 26  ?Glucose 70 - 99 mg/dL 14/07/22 (H)  ?BUN 6 - 23 mg/dL 16  ?Creatinine 0.40 - 1.50 mg/dL 546  ?Calcium 8.4 - 10.5 mg/dL 9.3  ?GFR >60.00 mL/min 102.81  ?Total CHOL/HDL Ratio  5  ?Cholesterol 0 - 200 mg/dL 2.70  ?HDL Cholesterol >39.00 mg/dL 350 (L)  ?Direct LDL mg/dL 09.38  ?MICROALB/CREAT RATIO 0.0 - 30.0 mg/g 1.0  ?Triglycerides 0.0 - 149.0 mg/dL 18.2 Triglyceride is over 400; calculations on Lipids are invalid. (H)  ? ? Latest Reference Range & Units 07/29/21 08:06  ?Creatinine,U mg/dL 14/07/22  ?Microalb, Ur 0.0 - 1.9 mg/dL 2.7 (H)  ?MICROALB/CREAT RATIO 0.0 - 30.0 mg/g 1.0  ? ? ? ? ?ASSESSMENT / PLAN / RECOMMENDATIONS:  ? ?1) Type 2 Diabetes Mellitus, Optimally  controlled, Without complications - Most recent A1c of 6.4%. Goal A1c < 7.0 %.   ? ? ?- I have praised the pt on improved glycemic control and weight loss ?- His A1c has decreased from 8.9% to 6.4%  ?- He is having  tight BG's in the middle of the day, will reduce AM glipizide  ?- He has not tried Rybelsus, will hold off at this time  ?- He is Intolerant to Metformin including ER formulations.  ? ? ?MEDICATIONS: ?Decrease Glipizide 10 mg, Half a tablet before breakfast and 1 tablet before supper ?Continue  Farxiga to 10 mg, 1  tablet before Breakfast  ? ? ? ?EDUCATION / INSTRUCTIONS: ?BG monitoring instructions: Patient is instructed to check his blood sugars 2-3 times a day, week. ?Call [facility] if: BG persistently < 70  ?I reviewed the Rule of 15 for the treatment of hypoglycemia in detail with the patient. Literature supplied. ? ? ? ?2) Diabetic complications:  ?Eye: Does not have known diabetic retinopathy.  ?Neuro/ Feet: Does not have known diabetic peripheral neuropathy .  ?Renal: Patient does not have known baseline CKD. He   is on an ACEI/ARB at present.  ? ? ?3) Dyslipidemia:  ?  ?- Pt assured me compliance with this, I am hoping Tg will continue to improve with glycemic improvement  ?- Fenfobrates added with a Tg of 433 mg/dL  ?  ?Continue  Atorvastatin 10 mg daily ?Continue fenofibrate 145 mg daily ? ?F/U in 6 months  ? ? ?Signed electronically by: ?Abby Raelyn MoraJaralla Sukari Grist, MD ? ?[facility]  ?[facility] ?[address] ?[place], [place] [address] ?Phone: [phone] ?FAX: [phone] ? ? ?CC: ?Esperanza RichtersSaguier, Edward, PA-C ?[address] ?[place] [id number] ?Phone: [phone]  ?Fax: [phone] ? ?Return to Endocrinology clinic as below: ?No future appointments. ? ?  ? ? ?

## 2021-10-29 NOTE — Patient Instructions (Signed)
-   Keep Up the Good Work ! ?- Decrease Glipizide 10  mg, to HALF a tablet before breakfast and continue ONE  tablet before supper ?- Continue  Farxiga 10 mg, 1 tablet before Breakfast  ?- No need to start Rybelsus  ? ?- Continue  Atorvastatin 10 mg at bedtime ( cholesterol ) ? ? ? ? ?HOW TO TREAT LOW BLOOD SUGARS (Blood sugar LESS THAN 70 MG/DL) ?Please follow the RULE OF 15 for the treatment of hypoglycemia treatment (when your (blood sugars are less than 70 mg/dL)  ? ?STEP 1: Take 15 grams of carbohydrates when your blood sugar is low, which includes:  ?3-4 GLUCOSE TABS  OR ?3-4 OZ OF JUICE OR REGULAR SODA OR ?ONE TUBE OF GLUCOSE GEL   ? ?STEP 2: RECHECK blood sugar in 15 MINUTES ?STEP 3: If your blood sugar is still low at the 15 minute recheck --> then, go back to STEP 1 and treat AGAIN with another 15 grams of carbohydrates. ? ?

## 2022-01-24 ENCOUNTER — Other Ambulatory Visit: Payer: Self-pay | Admitting: Medical

## 2022-04-25 ENCOUNTER — Other Ambulatory Visit: Payer: Self-pay | Admitting: Medical

## 2022-05-05 ENCOUNTER — Ambulatory Visit: Payer: Managed Care, Other (non HMO) | Admitting: Internal Medicine

## 2022-05-13 ENCOUNTER — Ambulatory Visit: Payer: Managed Care, Other (non HMO) | Admitting: Internal Medicine

## 2022-05-13 ENCOUNTER — Encounter: Payer: Self-pay | Admitting: Internal Medicine

## 2022-05-13 ENCOUNTER — Telehealth: Payer: Self-pay

## 2022-05-13 VITALS — BP 140/80 | HR 76 | Ht 71.0 in | Wt 263.0 lb

## 2022-05-13 DIAGNOSIS — E785 Hyperlipidemia, unspecified: Secondary | ICD-10-CM

## 2022-05-13 DIAGNOSIS — E119 Type 2 diabetes mellitus without complications: Secondary | ICD-10-CM | POA: Diagnosis not present

## 2022-05-13 LAB — POCT GLYCOSYLATED HEMOGLOBIN (HGB A1C): Hemoglobin A1C: 6.6 % — AB (ref 4.0–5.6)

## 2022-05-13 LAB — BASIC METABOLIC PANEL
BUN: 23 mg/dL (ref 6–23)
CO2: 25 mEq/L (ref 19–32)
Calcium: 9.9 mg/dL (ref 8.4–10.5)
Chloride: 103 mEq/L (ref 96–112)
Creatinine, Ser: 0.89 mg/dL (ref 0.40–1.50)
GFR: 98.25 mL/min (ref >60.00)
Glucose, Bld: 76 mg/dL (ref 70–99)
Potassium: 3.5 mEq/L (ref 3.5–5.1)
Sodium: 137 mEq/L (ref 135–145)

## 2022-05-13 LAB — LIPID PANEL
Cholesterol: 183 mg/dL (ref 0–200)
HDL: 43.7 mg/dL (ref >39.00)
LDL Cholesterol: 106 mg/dL — ABNORMAL HIGH (ref 0–99)
NonHDL: 139.53
Total CHOL/HDL Ratio: 4
Triglycerides: 167 mg/dL — ABNORMAL HIGH (ref 0.0–149.0)
VLDL: 33.4 mg/dL (ref 0.0–40.0)

## 2022-05-13 LAB — POCT GLUCOSE (DEVICE FOR HOME USE): POC Glucose: 74 mg/dl (ref 70–99)

## 2022-05-13 MED ORDER — DAPAGLIFLOZIN PROPANEDIOL 10 MG PO TABS: 10.0000 mg | ORAL_TABLET | Freq: Every day | ORAL | 3 refills | Status: DC

## 2022-05-13 MED ORDER — GLIPIZIDE 5 MG PO TABS: ORAL_TABLET | ORAL | 3 refills | Status: DC

## 2022-05-13 NOTE — Progress Notes (Signed)
Name: Anthony Zamora  Age/ Sex: 51 y.o., male   MRN/ DOB: [id number], 02/21/1971     PCP: Elise Benne   Reason for Endocrinology Evaluation: Type 2 Diabetes Mellitus  Initial Endocrine Consultative Visit: 11/19/2020    PATIENT IDENTIFIER: [name] is a 50 y.o. 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 The patient has followed with Endocrinology clinic since 11/19/2020 for consultative assistance with management of his diabetes.  DIABETIC HISTORY:  Anthony Zamora was diagnosed with DM in 2019, Metformin- explosive diarrhea. His hemoglobin A1c has ranged from 7.6% in 2020, peaking at 8.7% in 2019    On his initial visit to our clinic his A1c was 8.8% , he was not taking any medications despite listed medication of Januvia and Invokana. We started Glipizide and Wilder Glade    We started Rybelsus 07/2021 but also the pharmacy confirmed intermittent pick up of his glycemic agents on 07/29/2021  Works in maintenance at [employer]:   During the last visit (10/29/2021): A1c 6.4 %      Today (05/13/2022): Anthony Zamora is here for a follow up on diabetes management. He checks his blood sugars occasionally . 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  Pt has been noted with weight loss   Denies nausea , vomiting or diarrhea      HOME DIABETES REGIMEN:  Glipizide 10 mg, half a tablet before breakfast and 1 tablet before supper Farxiga 10 mg, 1 tablet before Breakfast  Atorvastatin 10 mg daily  Continue fenofibrate 145 mg daily    Statin: yes ACE-I/ARB: yes Prior Diabetic Education: no   METER DOWNLOAD SUMMARY: Did not bring       DIABETIC COMPLICATIONS: Microvascular complications:   Denies: CKD, retinopathy, neuropathy Last Eye Exam:  Completed 01/2022  Macrovascular complications:   Denies: CAD, CVA, PVD   HISTORY:  Past Medical History:  Past Medical History:  Diagnosis Date   Diabetes mellitus without complication ([place])    Hyperlipidemia    Hypertension    Past Surgical History:  Past Surgical History:  Procedure Laterality Date   TONSILLECTOMY     WISDOM TOOTH EXTRACTION     Social History:  reports that he has never smoked. He has never used smokeless tobacco. He reports that he does not currently use alcohol. He reports that he does not use drugs. Family History:  Family History  Problem Relation Age of Onset   Diabetes Father    Kidney disease Father    Colon cancer Neg Hx    Colon polyps Neg Hx    Esophageal cancer Neg Hx    Rectal cancer Neg Hx    Stomach cancer Neg Hx      HOME MEDICATIONS: Allergies as of 05/13/2022   No Known Allergies      Medication List        Accurate as of May 13, 2022 12:14 PM. If you have any questions, ask your nurse or doctor.          atorvastatin 10 MG tablet Commonly known as: LIPITOR Take 1 tablet (10 mg total) by mouth daily.   dapagliflozin propanediol 10 MG Tabs tablet Commonly known as: Farxiga Take 1 tablet (10 mg total) by mouth daily before breakfast.  fenofibrate 145 MG tablet Commonly known as: Tricor Take 1 tablet (145 mg total) by mouth daily.   glipiZIDE 10 MG tablet Commonly known as: GLUCOTROL Take 0.5 tablets (5 mg total) by mouth daily before breakfast AND 1 tablet (10 mg total) daily before supper.   hydrochlorothiazide 12.5 MG capsule Commonly known as: MICROZIDE TAKE 1 CAPSULE BY MOUTH EVERY DAY   losartan 25 MG tablet Commonly known as: COZAAR TAKE 1 TABLET (25 MG TOTAL) BY MOUTH DAILY.         OBJECTIVE:   Vital Signs: BP (!) 140/80 (BP Location: Left Arm, Patient Position: Sitting, Cuff Size: Large)   Pulse 76   Ht 5\' 11"  (1.803 m)   Wt 263 lb (119.3 kg)   SpO2 98%   BMI 36.68 kg/m   Wt  Readings from Last 3 Encounters:  05/13/22 263 lb (119.3 kg)  10/29/21 269 lb (122 kg)  02/26/21 268 lb (121.6 kg)     Exam: General: Pt appears well and is in NAD  Lungs: Clear with good BS bilat   Heart: RRR   Abdomen: soft, nontender  Extremities: No pretibial edema.   Neuro: MS is good with appropriate affect, pt is alert and Ox3   DM foot exam: 05/13/2022   The skin of the feet is intact without sores or ulcerations. The pedal pulses are 2+ on right and 2+ on left. The sensation is intact to a screening 5.07, 10 gram monofilament bilaterally        DATA REVIEWED:  Lab Results  Component Value Date   HGBA1C 6.6 (A) 05/13/2022   HGBA1C 6.4 (A) 10/29/2021   HGBA1C 8.9 (A) 07/29/2021    Latest Reference Range & Units 05/13/22 12:28  Sodium 135 - 145 mEq/L 137  Potassium 3.5 - 5.1 mEq/L 3.5  Chloride 96 - 112 mEq/L 103  CO2 19 - 32 mEq/L 25  Glucose 70 - 99 mg/dL 76  BUN 6 - 23 mg/dL 23  Creatinine 0.40 - 1.50 mg/dL 0.89  Calcium 8.4 - 10.5 mg/dL 9.9  GFR >60.00 mL/min 98.25  Total CHOL/HDL Ratio  4  Cholesterol 0 - 200 mg/dL 183  HDL Cholesterol >39.00 mg/dL 43.70  LDL (calc) 0 - 99 mg/dL 106 (H)  NonHDL  139.53  Triglycerides 0.0 - 149.0 mg/dL 167.0 (H)  VLDL 0.0 - 40.0 mg/dL 33.4     ASSESSMENT / PLAN / RECOMMENDATIONS:   1) Type 2 Diabetes Mellitus, Optimally  controlled, Without complications - Most recent A1c of 6.6%. Goal A1c < 7.0 %.     - I have praised the pt on improved glycemic control and weight loss - He is Intolerant to Metformin including ER formulations.  -I will change glipizide from 10 mg tablets to 5 mg tablets for ease of taking  MEDICATIONS: Change glipizide 5mg , 1  tablet before breakfast and 2 tablet before supper Continue  Farxiga  10 mg, 1 tablet before Breakfast     EDUCATION / INSTRUCTIONS: BG monitoring instructions: Patient is instructed to check his blood sugars 2-3 times a day, week. Call [facility]  if: BG persistently < 70  I reviewed the Rule of 15 for the treatment of hypoglycemia in detail with the patient. Literature supplied.    2) Diabetic complications:  Eye: Does not have known diabetic retinopathy.  Neuro/ Feet: Does not have known diabetic peripheral neuropathy .  Renal: Patient does not have known baseline CKD. He   is on an ACEI/ARB at present.  3) Dyslipidemia:    -Dramatic improvement in triglycerides levels, LDL increased -We will increase atorvastatin as below    Increase atorvastatin 20 mg daily Continue fenofibrate 145 mg daily  F/U in 6 months    Signed electronically by: Mack Guise, MD  [facility]  [facility] [address] [place], [place] [address] Phone: [phone] FAX: [phone]   CC: Elise Benne [id number] [place] [address] Phone: [phone]  Fax: [phone]  Return to Endocrinology clinic as below: No future appointments.

## 2022-05-13 NOTE — Patient Instructions (Signed)
-   Change  Glipizide 5  mg, 1  tablet before breakfast and TWO   tablets before supper - Continue  Farxiga 10 mg, 1 tablet before Breakfast       HOW TO TREAT LOW BLOOD SUGARS (Blood sugar LESS THAN 70 MG/DL) Please follow the RULE OF 15 for the treatment of hypoglycemia treatment (when your (blood sugars are less than 70 mg/dL)   STEP 1: Take 15 grams of carbohydrates when your blood sugar is low, which includes:  3-4 GLUCOSE TABS  OR 3-4 OZ OF JUICE OR REGULAR SODA OR ONE TUBE OF GLUCOSE GEL    STEP 2: RECHECK blood sugar in 15 MINUTES STEP 3: If your blood sugar is still low at the 15 minute recheck --> then, go back to STEP 1 and treat AGAIN with another 15 grams of carbohydrates.

## 2022-05-14 MED ORDER — ATORVASTATIN CALCIUM 20 MG PO TABS: 20.0000 mg | ORAL_TABLET | Freq: Every day | ORAL | 3 refills | Status: DC

## 2022-05-14 MED ORDER — FENOFIBRATE 145 MG PO TABS: 145.0000 mg | ORAL_TABLET | Freq: Every day | ORAL | 3 refills | Status: DC

## 2022-05-14 NOTE — Telephone Encounter (Signed)
Deuplicate

## 2022-06-02 ENCOUNTER — Other Ambulatory Visit: Payer: Self-pay | Admitting: Internal Medicine

## 2022-06-02 DIAGNOSIS — E1165 Type 2 diabetes mellitus with hyperglycemia: Secondary | ICD-10-CM

## 2022-11-12 NOTE — Progress Notes (Unsigned)
Name: Zaivion Gelinas  Age/ Sex: 52 y.o., male   MRN/ DOB: [id number], 09/12/1970     PCP: Elise Benne   Reason for Endocrinology Evaluation: Type 2 Diabetes Mellitus  Initial Endocrine Consultative Visit: 11/19/2020    PATIENT IDENTIFIER: Anthony Zamora is a 51 y.o. 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 The patient has followed with Endocrinology clinic since 11/19/2020 for consultative assistance with management of his diabetes.  DIABETIC HISTORY:  Mr. Blunk was diagnosed with DM in 2019, Metformin- explosive diarrhea. His hemoglobin A1c has ranged from 7.6% in 2020, peaking at 8.7% in 2019    On his initial visit to our clinic his A1c was 8.8% , he was not taking any medications despite listed medication of Januvia and Invokana. We started Glipizide and Wilder Glade    We started Rybelsus 07/2021 but also the pharmacy confirmed intermittent pick up of his glycemic agents on 07/29/2021  Works in maintenance at [employer]:   During the last visit (05/13/2022): A1c 6.6 %      Today (11/15/2022): Mr. Yarnell is here for a follow up on diabetes management. He checks his blood sugars occasionally . The patient has not had hypoglycemic episodes since the last clinic visit.   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  Denies nausea , vomiting or diarrhea      HOME DIABETES REGIMEN:  Glipizide 5 mg, 1 tablet before breakfast and 2 tablet before supper Farxiga 10 mg, 1 tablet before Breakfast  Atorvastatin 20 mg daily  Fenofibrate 145 mg daily    Statin: yes ACE-I/ARB: yes Prior Diabetic Education: no   METER DOWNLOAD SUMMARY: Did not bring       DIABETIC COMPLICATIONS: Microvascular complications:   Denies: CKD, retinopathy, neuropathy Last Eye Exam: Completed 01/2022  Macrovascular complications:   Denies: CAD, CVA, PVD   HISTORY:  Past  Medical History:  Past Medical History:  Diagnosis Date   Diabetes mellitus without complication ([place])    Hyperlipidemia    Hypertension    Past Surgical History:  Past Surgical History:  Procedure Laterality Date   TONSILLECTOMY     WISDOM TOOTH EXTRACTION     Social History:  reports that he has never smoked. He has never used smokeless tobacco. He reports that he does not currently use alcohol. He reports that he does not use drugs. Family History:  Family History  Problem Relation Age of Onset   Diabetes Father    Kidney disease Father    Colon cancer Neg Hx    Colon polyps Neg Hx    Esophageal cancer Neg Hx    Rectal cancer Neg Hx    Stomach cancer Neg Hx      HOME MEDICATIONS: Allergies as of 11/15/2022   No Known Allergies      Medication List        Accurate as of November 15, 2022  7:47 AM. If you have any questions, ask your nurse or doctor.          atorvastatin 20 MG tablet Commonly known as: LIPITOR Take 1 tablet (20 mg total) by mouth daily.   dapagliflozin propanediol 10 MG Tabs tablet Commonly known as: Farxiga Take 1 tablet (10 mg total) by mouth daily before breakfast.   fenofibrate 145 MG tablet Commonly known as: Tricor Take 1 tablet (145 mg total) by mouth daily.   glipiZIDE 5  MG tablet Commonly known as: GLUCOTROL Take 1 tablet (5 mg total) by mouth daily before breakfast AND 2 tablets (10 mg total) daily before supper.   hydrochlorothiazide 12.5 MG capsule Commonly known as: MICROZIDE TAKE 1 CAPSULE BY MOUTH EVERY DAY   losartan 25 MG tablet Commonly known as: COZAAR TAKE 1 TABLET (25 MG TOTAL) BY MOUTH DAILY.         OBJECTIVE:   Vital Signs: BP (!) 130/100 (BP Location: Right Arm, Patient Position: Sitting, Cuff Size: Normal)   Pulse 65   Ht 5\' 11"  (1.803 m)   Wt 269 lb 12.8 oz (122.4 kg)   SpO2 99%   BMI 37.63 kg/m   Wt Readings from Last 3 Encounters:  11/15/22 269 lb 12.8 oz (122.4 kg)  05/13/22 263 lb (119.3  kg)  10/29/21 269 lb (122 kg)     Exam: General: Pt appears well and is in NAD  Lungs: Clear with good BS bilat   Heart: RRR   Abdomen: soft, nontender  Extremities: No pretibial edema.   Neuro: MS is good with appropriate affect, pt is alert and Ox3   DM foot exam: 11/15/2022 The skin of the feet is intact without sores or ulcerations. The pedal pulses are 2+ on right and 2+ on left. The sensation is intact to a screening 5.07, 10 gram monofilament bilaterally        DATA REVIEWED:  Lab Results  Component Value Date   HGBA1C 6.6 (A) 05/13/2022   HGBA1C 6.4 (A) 10/29/2021   HGBA1C 8.9 (A) 07/29/2021    Latest Reference Range & Units 11/15/22 08:06  Sodium 135 - 145 mEq/L 138  Potassium 3.5 - 5.1 mEq/L 3.6  Chloride 96 - 112 mEq/L 108  CO2 19 - 32 mEq/L 23  Glucose 70 - 99 mg/dL 112 (H)  BUN 6 - 23 mg/dL 22  Creatinine 0.40 - 1.50 mg/dL 0.87  Calcium 8.4 - 10.5 mg/dL 9.0  GFR >60.00 mL/min 98.58  Total CHOL/HDL Ratio  3  Cholesterol 0 - 200 mg/dL 129  HDL Cholesterol >39.00 mg/dL 38.40 (L)  LDL (calc) 0 - 99 mg/dL 64  MICROALB/CREAT RATIO 0.0 - 30.0 mg/g 0.6  NonHDL  90.63  Triglycerides 0.0 - 149.0 mg/dL 134.0  VLDL 0.0 - 40.0 mg/dL 26.8    Latest Reference Range & Units 11/15/22 08:06  Creatinine,U mg/dL 117.3  Microalb, Ur 0.0 - 1.9 mg/dL <0.7  MICROALB/CREAT RATIO 0.0 - 30.0 mg/g 0.6    ASSESSMENT / PLAN / RECOMMENDATIONS:   1) Type 2 Diabetes Mellitus, Optimally  controlled, Without complications - Most recent A1c of 6.8%. Goal A1c < 7.0 %.     - A1c remains stable  - He is Intolerant to Metformin including ER formulations.  - No change   MEDICATIONS: Continue  glipizide 5mg , 1  tablet before breakfast and 2 tablet before supper Continue  Farxiga  10 mg, 1 tablet before Breakfast     EDUCATION / INSTRUCTIONS: BG monitoring instructions: Patient is instructed to check his blood sugars 2-3 times a day, week. Call [facility] if:  BG persistently < 70  I reviewed the Rule of 15 for the treatment of hypoglycemia in detail with the patient. Literature supplied.    2) Diabetic complications:  Eye: Does not have known diabetic retinopathy.  Neuro/ Feet: Does not have known diabetic peripheral neuropathy .  Renal: Patient does not have known baseline CKD. He   is on an ACEI/ARB at present.  3) Dyslipidemia:    -LDL and TG at goal   Continue atorvastatin 20 mg daily Continue fenofibrate 145 mg daily  F/U in 6 months    Signed electronically by: Mack Guise, MD  [facility] End[facility]  [facility] [address] [place],  [address] Phone: [phone] FAX: [phone]   CC: Elise Benne [id number] [place]  [address] [place] [address] Phone: [phone]  Fax: [phone]  Return to Endocrinology clinic as below: Future Appointments  Date Time Provider [place]  11/15/2022  7:50 AM Sabella Traore, Melanie Crazier, MD [facility] None

## 2022-11-15 ENCOUNTER — Encounter: Payer: Self-pay | Admitting: Internal Medicine

## 2022-11-15 ENCOUNTER — Ambulatory Visit (INDEPENDENT_AMBULATORY_CARE_PROVIDER_SITE_OTHER): Payer: Managed Care, Other (non HMO) | Admitting: Internal Medicine

## 2022-11-15 VITALS — BP 130/100 | HR 65 | Ht 71.0 in | Wt 269.8 lb

## 2022-11-15 DIAGNOSIS — E785 Hyperlipidemia, unspecified: Secondary | ICD-10-CM

## 2022-11-15 DIAGNOSIS — E119 Type 2 diabetes mellitus without complications: Secondary | ICD-10-CM

## 2022-11-15 LAB — POCT GLYCOSYLATED HEMOGLOBIN (HGB A1C): Hemoglobin A1C: 6.8 % — AB (ref 4.0–5.6)

## 2022-11-15 LAB — LIPID PANEL
Cholesterol: 129 mg/dL (ref 0–200)
HDL: 38.4 mg/dL — ABNORMAL LOW (ref >39.00)
LDL Cholesterol: 64 mg/dL (ref 0–99)
NonHDL: 90.63
Total CHOL/HDL Ratio: 3
Triglycerides: 134 mg/dL (ref 0.0–149.0)
VLDL: 26.8 mg/dL (ref 0.0–40.0)

## 2022-11-15 LAB — BASIC METABOLIC PANEL
BUN: 22 mg/dL (ref 6–23)
CO2: 23 mEq/L (ref 19–32)
Calcium: 9 mg/dL (ref 8.4–10.5)
Chloride: 108 mEq/L (ref 96–112)
Creatinine, Ser: 0.87 mg/dL (ref 0.40–1.50)
GFR: 98.58 mL/min (ref >60.00)
Glucose, Bld: 112 mg/dL — ABNORMAL HIGH (ref 70–99)
Potassium: 3.6 mEq/L (ref 3.5–5.1)
Sodium: 138 mEq/L (ref 135–145)

## 2022-11-15 LAB — MICROALBUMIN / CREATININE URINE RATIO
Creatinine,U: 117.3 mg/dL
Microalb Creat Ratio: 0.6 mg/g (ref 0.0–30.0)
Microalb, Ur: <0.7 mg/dL (ref 0.0–1.9)

## 2022-11-15 MED ORDER — ATORVASTATIN CALCIUM 20 MG PO TABS: 20.0000 mg | ORAL_TABLET | Freq: Every day | ORAL | 3 refills | Status: DC

## 2022-11-15 MED ORDER — FENOFIBRATE 145 MG PO TABS: 145.0000 mg | ORAL_TABLET | Freq: Every day | ORAL | 3 refills | Status: DC

## 2022-11-15 MED ORDER — GLIPIZIDE 5 MG PO TABS: ORAL_TABLET | ORAL | 3 refills | Status: DC

## 2022-11-15 MED ORDER — DAPAGLIFLOZIN PROPANEDIOL 10 MG PO TABS: 10.0000 mg | ORAL_TABLET | Freq: Every day | ORAL | 3 refills | Status: DC

## 2022-11-15 NOTE — Patient Instructions (Signed)
-   Continue Glipizide 5  mg, 1  tablet before breakfast and TWO   tablets before supper - Continue  Farxiga 10 mg, 1 tablet before Breakfast       HOW TO TREAT LOW BLOOD SUGARS (Blood sugar LESS THAN 70 MG/DL) Please follow the RULE OF 15 for the treatment of hypoglycemia treatment (when your (blood sugars are less than 70 mg/dL)   STEP 1: Take 15 grams of carbohydrates when your blood sugar is low, which includes:  3-4 GLUCOSE TABS  OR 3-4 OZ OF JUICE OR REGULAR SODA OR ONE TUBE OF GLUCOSE GEL    STEP 2: RECHECK blood sugar in 15 MINUTES STEP 3: If your blood sugar is still low at the 15 minute recheck --> then, go back to STEP 1 and treat AGAIN with another 15 grams of carbohydrates.

## 2023-01-28 ENCOUNTER — Other Ambulatory Visit: Payer: Self-pay | Admitting: Medical

## 2023-04-21 ENCOUNTER — Ambulatory Visit: Payer: Managed Care, Other (non HMO) | Admitting: Cardiology

## 2023-04-27 ENCOUNTER — Ambulatory Visit: Payer: Managed Care, Other (non HMO) | Attending: Cardiology

## 2023-05-03 ENCOUNTER — Other Ambulatory Visit: Payer: Self-pay | Admitting: Medical

## 2023-05-19 ENCOUNTER — Encounter: Payer: Self-pay | Admitting: Internal Medicine

## 2023-05-19 ENCOUNTER — Ambulatory Visit: Payer: Managed Care, Other (non HMO) | Admitting: Internal Medicine

## 2023-05-19 VITALS — BP 124/76 | HR 70 | Ht 71.0 in | Wt 267.0 lb

## 2023-05-19 DIAGNOSIS — E785 Hyperlipidemia, unspecified: Secondary | ICD-10-CM

## 2023-05-19 DIAGNOSIS — E119 Type 2 diabetes mellitus without complications: Secondary | ICD-10-CM

## 2023-05-19 DIAGNOSIS — Z7984 Long term (current) use of oral hypoglycemic drugs: Secondary | ICD-10-CM | POA: Insufficient documentation

## 2023-05-19 LAB — POCT GLUCOSE (DEVICE FOR HOME USE): Glucose Fasting, POC: 146 mg/dL — AB (ref 70–99)

## 2023-05-19 LAB — POCT GLYCOSYLATED HEMOGLOBIN (HGB A1C): Hemoglobin A1C: 6.6 % — AB (ref 4.0–5.6)

## 2023-05-19 MED ORDER — TIRZEPATIDE 5 MG/0.5ML ~~LOC~~ SOAJ: 5.0000 mg | SUBCUTANEOUS | 3 refills | Status: DC

## 2023-05-19 MED ORDER — TIRZEPATIDE 2.5 MG/0.5ML ~~LOC~~ SOAJ: 2.5000 mg | SUBCUTANEOUS | 0 refills | Status: DC

## 2023-05-19 MED ORDER — GLIPIZIDE 5 MG PO TABS: 10.0000 mg | ORAL_TABLET | Freq: Every day | ORAL | 3 refills | Status: DC

## 2023-05-19 MED ORDER — DAPAGLIFLOZIN PROPANEDIOL 10 MG PO TABS
10.0000 mg | ORAL_TABLET | Freq: Every day | ORAL | 3 refills | Status: DC
Start: 2023-05-19 — End: 2023-10-28

## 2023-05-19 NOTE — Progress Notes (Signed)
Name: Anthony Zamora  Age/ Sex: 52 y.o., male   MRN/ DOB: [id number], Nov 21, 1970     PCP: Marisue Brooklyn   Reason for Endocrinology Evaluation: Type 2 Diabetes Mellitus  Initial Endocrine Consultative Visit: 11/19/2020    PATIENT IDENTIFIER: [name] is a 52 y.o. 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 The patient has followed with Endocrinology clinic since 11/19/2020 for consultative assistance with management of his diabetes.  DIABETIC HISTORY:  Mr. Ohler was diagnosed with DM in 2019, Metformin- explosive diarrhea. His hemoglobin A1c has ranged from 7.6% in 2020, peaking at 8.7% in 2019    On his initial visit to our clinic his A1c was 8.8% , he was not taking any medications despite listed medication of Januvia and Invokana. We started Glipizide and Marcelline Deist    We started Rybelsus 07/2021 but also the pharmacy confirmed intermittent pick up of his glycemic agents on 07/29/2021  Works in maintenance at [employer]    SUBJECTIVE:   During the last visit (11/15/2022): A1c 6.8 %      Today (05/19/2023): [name] is here for a follow up on diabetes management. He checks his blood sugars occasionally . The patient has not had hypoglycemic episodes since the last clinic visit.   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  Weight has been stable Denies nausea , vomiting  Denies constipation or diarrhea      HOME DIABETES REGIMEN:  Glipizide 5 mg, 1 tablet before breakfast and 2 tablet before supper Farxiga 10 mg, 1 tablet before Breakfast  Atorvastatin 20 mg daily  Fenofibrate 145 mg daily    Statin: yes ACE-I/ARB: yes Prior Diabetic Education: no   METER DOWNLOAD SUMMARY: Did not bring       DIABETIC COMPLICATIONS: Microvascular complications:   Denies: CKD, retinopathy, neuropathy Last Eye Exam: Completed 01/2022  Macrovascular complications:    Denies: CAD, CVA, PVD   HISTORY:  Past Medical History:  Past Medical History:  Diagnosis Date   Diabetes mellitus without complication ([facility])    Hyperlipidemia    Hypertension    Past Surgical History:  Past Surgical History:  Procedure Laterality Date   TONSILLECTOMY     WISDOM TOOTH EXTRACTION     Social History:  reports that he has never smoked. He has never used smokeless tobacco. He reports that he does not currently use alcohol. He reports that he does not use drugs. Family History:  Family History  Problem Relation Age of Onset   Diabetes Father    Kidney disease Father    Colon cancer Neg Hx    Colon polyps Neg Hx    Esophageal cancer Neg Hx    Rectal cancer Neg Hx    Stomach cancer Neg Hx      HOME MEDICATIONS: Allergies as of 05/19/2023   No Known Allergies      Medication List        Accurate as of May 19, 2023  8:18 AM. If you have any questions, ask your nurse or doctor.          atorvastatin 20 MG tablet Commonly known as: LIPITOR Take 1 tablet (20 mg total) by mouth daily.   dapagliflozin propanediol 10 MG Tabs tablet Commonly known as: Farxiga Take 1 tablet (10 mg total) by mouth daily before breakfast.   fenofibrate 145 MG tablet Commonly known as: Tricor Take 1 tablet (145 mg total)  by mouth daily.   glipiZIDE 5 MG tablet Commonly known as: GLUCOTROL Take 1 tablet (5 mg total) by mouth daily before breakfast AND 2 tablets (10 mg total) daily before supper.   hydrochlorothiazide 12.5 MG capsule Commonly known as: MICROZIDE TAKE 1 CAPSULE BY MOUTH EVERY DAY What changed:  how much to take additional instructions   losartan 25 MG tablet Commonly known as: COZAAR TAKE 1 TABLET (25 MG TOTAL) BY MOUTH DAILY.         OBJECTIVE:   Vital Signs: BP 124/76 (BP Location: Left Arm, Patient Position: Sitting, Cuff Size: Large)   Pulse 70   Ht 5\' 11"  (1.803 m)   Wt 267 lb (121.1 kg)   SpO2 96%   BMI 37.24 kg/m   Wt  Readings from Last 3 Encounters:  05/19/23 267 lb (121.1 kg)  11/15/22 269 lb 12.8 oz (122.4 kg)  05/13/22 263 lb (119.3 kg)     Exam: General: Pt appears well and is in NAD  Lungs: Clear with good BS bilat   Heart: RRR   Abdomen: soft, nontender  Extremities: No pretibial edema.   Neuro: MS is good with appropriate affect, pt is alert and Ox3   DM foot exam: 11/15/2022 The skin of the feet is intact without sores or ulcerations. The pedal pulses are 2+ on right and 2+ on left. The sensation is intact to a screening 5.07, 10 gram monofilament bilaterally        DATA REVIEWED:  Lab Results  Component Value Date   HGBA1C 6.6 (A) 05/19/2023   HGBA1C 6.8 (A) 11/15/2022   HGBA1C 6.6 (A) 05/13/2022    Latest Reference Range & Units 11/15/22 08:06  Sodium 135 - 145 mEq/L 138  Potassium 3.5 - 5.1 mEq/L 3.6  Chloride 96 - 112 mEq/L 108  CO2 19 - 32 mEq/L 23  Glucose 70 - 99 mg/dL 161 (H)  BUN 6 - 23 mg/dL 22  Creatinine 0.96 - 0.45 mg/dL 4.09  Calcium 8.4 - 81.1 mg/dL 9.0  GFR >91.47 mL/min 98.58  Total CHOL/HDL Ratio  3  Cholesterol 0 - 200 mg/dL 829  HDL Cholesterol >56.21 mg/dL 30.86 (L)  LDL (calc) 0 - 99 mg/dL 64  MICROALB/CREAT RATIO 0.0 - 30.0 mg/g 0.6  NonHDL  90.63  Triglycerides 0.0 - 149.0 mg/dL 578.4  VLDL 0.0 - 69.6 mg/dL 29.5    Latest Reference Range & Units 11/15/22 08:06  Creatinine,U mg/dL 284.1  Microalb, Ur 0.0 - 1.9 mg/dL <3.2  MICROALB/CREAT RATIO 0.0 - 30.0 mg/g 0.6    ASSESSMENT / PLAN / RECOMMENDATIONS:   1) Type 2 Diabetes Mellitus, Optimally  controlled, Without complications - Most recent A1c of 6.6%. Goal A1c < 7.0 %.     - A1c remains at goal - He is Intolerant to Metformin including ER formulations.  -Today we discussed adding GLP-1 agonist due to weight loss, cardiovascular and renal benefits, caution against GI side effects -Patient will start Mounjaro as below -I will decrease glipizide preemptively to prevent hypoglycemia, by  skipping morning glipizide, he will only take it before supper from now on  MEDICATIONS: Start Mounjaro 2.5 mg weekly for 2 months, then increase to 5 mg weekly Decrease glipizide 5mg , to 2 tablet before supper Continue  Farxiga  10 mg, 1 tablet before Breakfast     EDUCATION / INSTRUCTIONS: BG monitoring instructions: Patient is instructed to check his blood sugars 2-3 times a day, week. Call [facility] if: BG persistently <  70  I reviewed the Rule of 15 for the treatment of hypoglycemia in detail with the patient. Literature supplied.    2) Diabetic complications:  Eye: Does not have known diabetic retinopathy.  Neuro/ Feet: Does not have known diabetic peripheral neuropathy .  Renal: Patient does not have known baseline CKD. He   is on an ACEI/ARB at present.    3) Dyslipidemia:    -LDL and TG at goal   Continue atorvastatin 20 mg daily Continue fenofibrate 145 mg daily  F/U in 4 months    Signed electronically by: Lyndle Herrlich, MD  [facility] Endocrinology  [facility] [address] [place] [place], [place] [address] Phone: [phone] FAX: [phone]   CC: Marisue Brooklyn [address] [place] [address] Phone: [phone]  Fax: [phone]  Return to Endocrinology clinic as below: No future appointments.

## 2023-05-19 NOTE — Patient Instructions (Signed)
-  Start Mounjaro 2.5 mg once weekly for 2 months, if no side effects please pick up Mounjaro 5 mg once weekly dose -Decrease glipizide 5  mg, to TWO   tablets before supper only (no more in the morning) -Continue  Farxiga 10 mg, 1 tablet before Breakfast       HOW TO TREAT LOW BLOOD SUGARS (Blood sugar LESS THAN 70 MG/DL) Please follow the RULE OF 15 for the treatment of hypoglycemia treatment (when your (blood sugars are less than 70 mg/dL)   STEP 1: Take 15 grams of carbohydrates when your blood sugar is low, which includes:  3-4 GLUCOSE TABS  OR 3-4 OZ OF JUICE OR REGULAR SODA OR ONE TUBE OF GLUCOSE GEL    STEP 2: RECHECK blood sugar in 15 MINUTES STEP 3: If your blood sugar is still low at the 15 minute recheck --> then, go back to STEP 1 and treat AGAIN with another 15 grams of carbohydrates.

## 2023-07-26 ENCOUNTER — Other Ambulatory Visit: Payer: Self-pay | Admitting: Internal Medicine

## 2023-09-26 NOTE — Progress Notes (Deleted)
 Name: Anthony Zamora  Age/ Sex: 53 y.o., male   MRN/ DOB: [id number], 04/09/1971     PCP: Dorina Dallas RIGGERS   Reason for Endocrinology Evaluation: Type 2 Diabetes Mellitus  Initial Endocrine Consultative Visit: 11/19/2020    PATIENT IDENTIFIER: [name] is a 53 y.o. 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 The patient has followed with Endocrinology clinic since 11/19/2020 for consultative assistance with management of his diabetes.  DIABETIC HISTORY:  [name] was diagnosed with DM in 2019, Metformin - explosive diarrhea. His hemoglobin A1c has ranged from 7.6% in 2020, peaking at 8.7% in 2019    On his initial visit to our clinic his A1c was 8.8% , he was not taking any medications despite listed medication of Januvia  and Invokana . 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 is here for a follow up on diabetes management. He checks his blood sugars occasionally . The patient has not had hypoglycemic episodes since the last clinic visit.   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  Weight has been stable Denies nausea , vomiting  Denies constipation or diarrhea      HOME DIABETES REGIMEN:  Glipizide  5 mg,  2 tablet before supper Farxiga  10 mg, 1 tablet before Breakfast  Mounjaro  5 mg weekly Atorvastatin  20 mg daily  Fenofibrate  145 mg daily    Statin: yes ACE-I/ARB: yes Prior Diabetic Education: no   METER DOWNLOAD SUMMARY: Did not bring       DIABETIC COMPLICATIONS: Microvascular complications:   Denies: CKD, retinopathy, neuropathy Last Eye Exam: Completed  01/2022  Macrovascular complications:   Denies: CAD, CVA, PVD   HISTORY:  Past Medical History:  Past Medical History:  Diagnosis Date   Diabetes mellitus without complication (H[facility])    Hyperlipidemia    Hypertension    Past Surgical History:  Past Surgical History:  Procedure Laterality Date   TONSILLECTOMY     WISDOM TOOTH EXTRACTION     Social History:  reports that he has never smoked. He has never used smokeless tobacco. He reports that he does not currently use alcohol. He reports that he does not use drugs. Family History:  Family History  Problem Relation Age of Onset   Diabetes Father    Kidney disease Father    Colon cancer Neg Hx    Colon polyps Neg Hx    Esophageal cancer Neg Hx    Rectal cancer Neg Hx    Stomach cancer Neg Hx      HOME MEDICATIONS: Allergies as of 09/27/2023   No Known Allergies      Medication List        Accurate as of September 26, 2023  3:02 PM. If you have any questions, ask your nurse or doctor.          atorvastatin  20 MG tablet Commonly known as: LIPITOR Take 1 tablet (20 mg total) by mouth daily.   dapagliflozin  propanediol 10 MG Tabs tablet Commonly known as: Farxiga  Take 1 tablet (10 mg total) by mouth daily before breakfast.   fenofibrate  145 MG tablet Commonly known as: Tricor  Take  1 tablet (145 mg total) by mouth daily.   glipiZIDE  5 MG tablet Commonly known as: GLUCOTROL  Take 2 tablets (10 mg total) by mouth daily before supper.   hydrochlorothiazide  12.5 MG capsule Commonly known as: MICROZIDE  TAKE 1 CAPSULE BY MOUTH EVERY DAY What changed:  how much to take additional instructions   losartan  25 MG tablet Commonly known as: COZAAR  TAKE 1 TABLET (25 MG TOTAL) BY MOUTH DAILY.   tirzepatide  2.5 MG/0.5ML Pen Commonly known as: MOUNJARO  Inject 2.5 mg into the skin once a week. Start with 2.5 mg weekly for 2 months then dispense the 5 mg dose   tirzepatide  5 MG/0.5ML Pen Commonly known as:  MOUNJARO  Inject 5 mg into the skin once a week. Keep on hold         OBJECTIVE:   Vital Signs: There were no vitals taken for this visit.  Wt Readings from Last 3 Encounters:  05/19/23 267 lb (121.1 kg)  11/15/22 269 lb 12.8 oz (122.4 kg)  05/13/22 263 lb (119.3 kg)     Exam: General: Pt appears well and is in NAD  Lungs: Clear with good BS bilat   Heart: RRR   Abdomen: soft, nontender  Extremities: No pretibial edema.   Neuro: MS is good with appropriate affect, pt is alert and Ox3   DM foot exam: 11/15/2022 The skin of the feet is intact without sores or ulcerations. The pedal pulses are 2+ on right and 2+ on left. The sensation is intact to a screening 5.07, 10 gram monofilament bilaterally        DATA REVIEWED:  Lab Results  Component Value Date   HGBA1C 6.6 (A) 05/19/2023   HGBA1C 6.8 (A) 11/15/2022   HGBA1C 6.6 (A) 05/13/2022    Latest Reference Range & Units 11/15/22 08:06  Sodium 135 - 145 mEq/L 138  Potassium 3.5 - 5.1 mEq/L 3.6  Chloride 96 - 112 mEq/L 108  CO2 19 - 32 mEq/L 23  Glucose 70 - 99 mg/dL 887 (H)  BUN 6 - 23 mg/dL 22  Creatinine 9.59 - 8.49 mg/dL 9.12  Calcium  8.4 - 10.5 mg/dL 9.0  GFR >39.99 mL/min 98.58  Total CHOL/HDL Ratio  3  Cholesterol 0 - 200 mg/dL 870  HDL Cholesterol >60.99 mg/dL 61.59 (L)  LDL (calc) 0 - 99 mg/dL 64  MICROALB/CREAT RATIO 0.0 - 30.0 mg/g 0.6  NonHDL  90.63  Triglycerides 0.0 - 149.0 mg/dL 865.9  VLDL 0.0 - 59.9 mg/dL 73.1    Latest Reference Range & Units 11/15/22 08:06  Creatinine,U mg/dL 882.6  Microalb, Ur 0.0 - 1.9 mg/dL <9.2  MICROALB/CREAT RATIO 0.0 - 30.0 mg/g 0.6    ASSESSMENT / PLAN / RECOMMENDATIONS:   1) Type 2 Diabetes Mellitus, Optimally  controlled, Without complications - Most recent A1c of 6.6%. Goal A1c < 7.0 %.     - A1c remains at goal - He is Intolerant to Metformin  including ER formulations.  -Today we discussed adding GLP-1 agonist due to weight loss, cardiovascular and  renal benefits, caution against GI side effects -Patient will start Mounjaro  as below -I will decrease glipizide  preemptively to prevent hypoglycemia, by skipping morning glipizide , he will only take it before supper from now on  MEDICATIONS: Start Mounjaro  2.5 mg weekly for 2 months, then increase to 5 mg weekly Decrease glipizide  5mg , to 2 tablet before supper Continue  Farxiga   10 mg, 1 tablet before Breakfast     EDUCATION / INSTRUCTIONS: BG monitoring instructions: Patient is  instructed to check his blood sugars 2-3 times a day, week. Call  Endocrinology clinic if: BG persistently < 70  I reviewed the Rule of 15 for the treatment of hypoglycemia in detail with the patient. Literature supplied.    2) Diabetic complications:  Eye: Does not have known diabetic retinopathy.  Neuro/ Feet: Does not have known diabetic peripheral neuropathy .  Renal: Patient does not have known baseline CKD. He   is on an ACEI/ARB at present.    3) Dyslipidemia:    -LDL and TG at goal   Continue atorvastatin  20 mg daily Continue fenofibrate  145 mg daily  F/U in 4 months    Signed electronically by: Stefano Redgie Butts, MD  [facility] Endocrinology  [facility] [address] [place] [place], [place] [address] Phone: [phone] FAX: [phone]   CC: Dorina Dallas RIGGERS [address] [place] [address] Phone: [phone]  Fax: [phone]  Return to Endocrinology clinic as below: Future Appointments  Date Time Provider Department Center  09/27/2023  7:30 AM Maurita Havener, Donell Redgie, MD [facility] None

## 2023-09-27 ENCOUNTER — Ambulatory Visit: Payer: Managed Care, Other (non HMO) | Admitting: Internal Medicine

## 2023-10-03 ENCOUNTER — Ambulatory Visit: Payer: Managed Care, Other (non HMO) | Admitting: Internal Medicine

## 2023-10-19 ENCOUNTER — Other Ambulatory Visit: Payer: Self-pay | Admitting: Medical

## 2023-10-26 ENCOUNTER — Other Ambulatory Visit: Payer: Self-pay | Admitting: Medical

## 2023-10-28 ENCOUNTER — Telehealth: Payer: Managed Care, Other (non HMO) | Admitting: Internal Medicine

## 2023-10-28 DIAGNOSIS — E119 Type 2 diabetes mellitus without complications: Secondary | ICD-10-CM | POA: Diagnosis not present

## 2023-10-28 DIAGNOSIS — Z7985 Long-term (current) use of injectable non-insulin antidiabetic drugs: Secondary | ICD-10-CM | POA: Diagnosis not present

## 2023-10-28 DIAGNOSIS — E785 Hyperlipidemia, unspecified: Secondary | ICD-10-CM | POA: Diagnosis not present

## 2023-10-28 MED ORDER — TIRZEPATIDE 7.5 MG/0.5ML ~~LOC~~ SOAJ: 7.5000 mg | SUBCUTANEOUS | 3 refills | Status: AC

## 2023-10-28 MED ORDER — FENOFIBRATE 145 MG PO TABS: 145.0000 mg | ORAL_TABLET | Freq: Every day | ORAL | 3 refills | Status: AC

## 2023-10-28 MED ORDER — ATORVASTATIN CALCIUM 20 MG PO TABS: 20.0000 mg | ORAL_TABLET | Freq: Every day | ORAL | 3 refills | Status: AC

## 2023-10-28 MED ORDER — DAPAGLIFLOZIN PROPANEDIOL 10 MG PO TABS: 10.0000 mg | ORAL_TABLET | Freq: Every day | ORAL | 3 refills | Status: AC

## 2023-10-28 NOTE — Progress Notes (Signed)
 Virtual Visit via Video Note  I connected with Anthony Zamora on 10/28/23 at  1:00 PM EST by a video enabled telemedicine application and verified that I am speaking with the correct person using two identifiers.   I discussed the limitations of evaluation and management by telemedicine and the availability of in person appointments. The patient expressed understanding and agreed to proceed.   -Location of the patient :Work -Location of the provider : Office -The names of all persons participating in the telemedicine service : Pt and myself         Name: Anthony Zamora  Age/ Sex: 53 y.o., male   MRN/ DOB: [id number], 16-Jun-1971     PCP: Marisue Brooklyn   Reason for Endocrinology Evaluation: Type 2 Diabetes Mellitus  Initial Endocrine Consultative Visit: 11/19/2020    PATIENT IDENTIFIER: Anthony Zamora is a 53 y.o. 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 The patient has followed with Endocrinology clinic since 11/19/2020 for consultative assistance with management of his diabetes.  DIABETIC HISTORY:  Anthony Zamora was diagnosed with DM in 2019, Metformin- explosive diarrhea. His hemoglobin A1c has ranged from 7.6% in 2020, peaking at 8.7% in 2019    On his initial visit to our clinic his A1c was 8.8% , he was not taking any medications despite listed medication of Januvia and Invokana. 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 He checks his blood sugars occasionally . The patient has not had hypoglycemic episodes since the last clinic visit.   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, which  was excised 04/16/2022  He did not notice any weight loss on Mounjaro He is currently having nasal congestion, dry cough and slight fever Denies nausea , vomiting  Denies constipation or diarrhea      HOME DIABETES REGIMEN:  Glipizide 5 mg, 2 tablet before supper Farxiga 10 mg, 1 tablet before Breakfast  Mounjaro 5 mg weekly Atorvastatin 20 mg daily  Fenofibrate 145 mg daily    Statin: yes ACE-I/ARB: yes Prior Diabetic Education: no   METER DOWNLOAD SUMMARY: 60-120 mg/dL      DIABETIC COMPLICATIONS: Microvascular complications:   Denies: CKD, retinopathy, neuropathy Last Eye Exam: Completed 01/2022  Macrovascular complications:   Denies: CAD, CVA, PVD   HISTORY:  Past Medical History:  Past Medical History:  Diagnosis Date   Diabetes mellitus without complication (HCC)    Hyperlipidemia    Hypertension    Past Surgical History:  Past Surgical History:  Procedure Laterality Date   TONSILLECTOMY     WISDOM TOOTH EXTRACTION     Social History:  reports that he has never smoked. He has never used smokeless tobacco. He reports that he does not currently use alcohol. He reports that he does not use drugs. Family History:  Family History  Problem Relation Age of Onset   Diabetes Father    Kidney disease Father    Colon cancer Neg Hx    Colon polyps Neg Hx    Esophageal cancer Neg Hx    Rectal cancer Neg Hx    Stomach cancer Neg  Hx      HOME MEDICATIONS: Allergies as of 10/28/2023   No Known Allergies      Medication List        Accurate as of October 28, 2023 12:48 PM. If you have any questions, ask your nurse or doctor.          atorvastatin 20 MG tablet Commonly known as: LIPITOR Take 1 tablet (20 mg total) by mouth daily.   dapagliflozin propanediol 10 MG Tabs tablet Commonly known as: Farxiga Take 1 tablet (10 mg total) by mouth daily before breakfast.   fenofibrate 145 MG tablet Commonly known as: Tricor Take 1 tablet (145 mg  total) by mouth daily.   glipiZIDE 5 MG tablet Commonly known as: GLUCOTROL Take 2 tablets (10 mg total) by mouth daily before supper.   hydrochlorothiazide 12.5 MG capsule Commonly known as: MICROZIDE Take 1 capsule (12.5 mg total) by mouth daily. TAKE 1 CAPSULE BY MOUTH EVERY DAY   losartan 25 MG tablet Commonly known as: COZAAR TAKE 1 TABLET (25 MG TOTAL) BY MOUTH DAILY.   tirzepatide 2.5 MG/0.5ML Pen Commonly known as: MOUNJARO Inject 2.5 mg into the skin once a week. Start with 2.5 mg weekly for 2 months then dispense the 5 mg dose   tirzepatide 5 MG/0.5ML Pen Commonly known as: MOUNJARO Inject 5 mg into the skin once a week. Keep on hold         OBJECTIVE:   Vital Signs: There were no vitals taken for this visit.  Wt Readings from Last 3 Encounters:  05/19/23 267 lb (121.1 kg)  11/15/22 269 lb 12.8 oz (122.4 kg)  05/13/22 263 lb (119.3 kg)     Exam: General: Pt appears well and is in NAD  Neuro: MS is good with appropriate affect, pt is alert and Ox3   DM foot exam: 11/15/2022 The skin of the feet is intact without sores or ulcerations. The pedal pulses are 2+ on right and 2+ on left. The sensation is intact to a screening 5.07, 10 gram monofilament bilaterally        DATA REVIEWED:  Lab Results  Component Value Date   HGBA1C 6.6 (A) 05/19/2023   HGBA1C 6.8 (A) 11/15/2022   HGBA1C 6.6 (A) 05/13/2022    Latest Reference Range & Units 11/15/22 08:06  Sodium 135 - 145 mEq/L 138  Potassium 3.5 - 5.1 mEq/L 3.6  Chloride 96 - 112 mEq/L 108  CO2 19 - 32 mEq/L 23  Glucose 70 - 99 mg/dL 914 (H)  BUN 6 - 23 mg/dL 22  Creatinine 7.82 - 9.56 mg/dL 2.13  Calcium 8.4 - 08.6 mg/dL 9.0  GFR >57.84 mL/min 98.58  Total CHOL/HDL Ratio  3  Cholesterol 0 - 200 mg/dL 696  HDL Cholesterol >29.52 mg/dL 84.13 (L)  LDL (calc) 0 - 99 mg/dL 64  MICROALB/CREAT RATIO 0.0 - 30.0 mg/g 0.6  NonHDL  90.63  Triglycerides 0.0 - 149.0 mg/dL 244.0  VLDL 0.0 - 10.2 mg/dL 72.5     Latest Reference Range & Units 11/15/22 08:06  Creatinine,U mg/dL 366.4  Microalb, Ur 0.0 - 1.9 mg/dL <4.0  MICROALB/CREAT RATIO 0.0 - 30.0 mg/g 0.6    ASSESSMENT / PLAN / RECOMMENDATIONS:   1) Type 2 Diabetes Mellitus, Optimally  controlled, Without complications - Most recent A1c of 6.6%. Goal A1c < 7.0 %.      - He is Intolerant to Metformin including ER formulations.  -Patient had a BG reading of 60 Mg/DL this morning -  I have recommended discontinuing glipizide -I will increase Mounjaro    MEDICATIONS: Increase Mounjaro 7.5 mg weekly  Stop glipizide 5mg , to 2 tablet before supper Continue  Farxiga  10 mg, 1 tablet before Breakfast     EDUCATION / INSTRUCTIONS: BG monitoring instructions: Patient is instructed to check his blood sugars 2-3 times a day, week. Call [facility] if: BG persistently < 70  I reviewed the Rule of 15 for the treatment of hypoglycemia in detail with the patient. Literature supplied.    2) Diabetic complications:  Eye: Does not have known diabetic retinopathy.  Neuro/ Feet: Does not have known diabetic peripheral neuropathy .  Renal: Patient does not have known baseline CKD. He   is on an ACEI/ARB at present.    3) Dyslipidemia:    -LDL and TG at goal in the past -He will have his physical soon -No changes at this time   Continue atorvastatin 20 mg daily Continue fenofibrate 145 mg daily  F/U in 6 months    Signed electronically by: Lyndle Herrlich, MD  [facility] Endocrinology  [facility] [address] [place] [address] [place], [place] [address] Phone: [phone] FAX: [phone]   CC: Marisue Brooklyn [address] [place] [address] Phone: [phone]  Fax: [phone]  Return to Endocrinology clinic as below: Future Appointments  Date Time Provider Department Center  10/28/2023  1:00 PM Finneus Kaneshiro, Konrad Dolores, MD [id number] None

## 2023-12-08 ENCOUNTER — Other Ambulatory Visit: Payer: Self-pay | Admitting: Internal Medicine

## 2024-04-10 ENCOUNTER — Other Ambulatory Visit: Payer: Self-pay | Admitting: Medical

## 2024-04-22 ENCOUNTER — Other Ambulatory Visit: Payer: Self-pay | Admitting: Medical

## 2024-07-11 ENCOUNTER — Other Ambulatory Visit: Payer: Self-pay | Admitting: Medical

## 2024-07-12 ENCOUNTER — Other Ambulatory Visit: Payer: Self-pay | Admitting: Internal Medicine
